# Patient Record
Sex: Male | Born: 1965 | Race: White | Hispanic: No | Marital: Single | Smoking: Current every day smoker
Health system: Southern US, Community
[De-identification: ages and names within clinical notes are randomized; demographics above are authoritative.]

## PROBLEM LIST (undated history)

## (undated) DIAGNOSIS — F1010 Alcohol abuse, uncomplicated: Secondary | ICD-10-CM

## (undated) DIAGNOSIS — K7460 Unspecified cirrhosis of liver: Secondary | ICD-10-CM

## (undated) HISTORY — PX: OTHER SURGICAL HISTORY: SHX169

---

## 2003-05-26 ENCOUNTER — Ambulatory Visit (HOSPITAL_COMMUNITY): Admission: RE | Admit: 2003-05-26 | Discharge: 2003-05-26 | Payer: Self-pay | Admitting: Internal Medicine

## 2006-06-20 ENCOUNTER — Emergency Department (HOSPITAL_COMMUNITY): Admission: EM | Admit: 2006-06-20 | Discharge: 2006-06-20 | Payer: Self-pay | Admitting: Emergency Medicine

## 2006-06-22 ENCOUNTER — Emergency Department (HOSPITAL_COMMUNITY): Admission: EM | Admit: 2006-06-22 | Discharge: 2006-06-22 | Payer: Self-pay | Admitting: Emergency Medicine

## 2016-07-11 ENCOUNTER — Encounter (HOSPITAL_COMMUNITY): Payer: Self-pay

## 2016-07-11 ENCOUNTER — Inpatient Hospital Stay (HOSPITAL_COMMUNITY)
Admission: EM | Admit: 2016-07-11 | Discharge: 2016-07-16 | DRG: 432 | Disposition: A | Discharge status: Home / Self Care | Payer: Self-pay | Attending: Internal Medicine | Admitting: Internal Medicine

## 2016-07-11 ENCOUNTER — Emergency Department (HOSPITAL_COMMUNITY): Payer: Self-pay

## 2016-07-11 DIAGNOSIS — E871 Hypo-osmolality and hyponatremia: Secondary | ICD-10-CM | POA: Diagnosis present

## 2016-07-11 DIAGNOSIS — E878 Other disorders of electrolyte and fluid balance, not elsewhere classified: Secondary | ICD-10-CM | POA: Diagnosis present

## 2016-07-11 DIAGNOSIS — Z880 Allergy status to penicillin: Secondary | ICD-10-CM

## 2016-07-11 DIAGNOSIS — K3189 Other diseases of stomach and duodenum: Secondary | ICD-10-CM | POA: Diagnosis present

## 2016-07-11 DIAGNOSIS — D62 Acute posthemorrhagic anemia: Secondary | ICD-10-CM

## 2016-07-11 DIAGNOSIS — I864 Gastric varices: Secondary | ICD-10-CM | POA: Diagnosis present

## 2016-07-11 DIAGNOSIS — F1010 Alcohol abuse, uncomplicated: Secondary | ICD-10-CM | POA: Diagnosis present

## 2016-07-11 DIAGNOSIS — K449 Diaphragmatic hernia without obstruction or gangrene: Secondary | ICD-10-CM | POA: Diagnosis present

## 2016-07-11 DIAGNOSIS — M79606 Pain in leg, unspecified: Secondary | ICD-10-CM

## 2016-07-11 DIAGNOSIS — F10231 Alcohol dependence with withdrawal delirium: Secondary | ICD-10-CM | POA: Diagnosis present

## 2016-07-11 DIAGNOSIS — K7031 Alcoholic cirrhosis of liver with ascites: Principal | ICD-10-CM | POA: Diagnosis present

## 2016-07-11 DIAGNOSIS — F1220 Cannabis dependence, uncomplicated: Secondary | ICD-10-CM

## 2016-07-11 DIAGNOSIS — R7989 Other specified abnormal findings of blood chemistry: Secondary | ICD-10-CM

## 2016-07-11 DIAGNOSIS — E0781 Sick-euthyroid syndrome: Secondary | ICD-10-CM | POA: Diagnosis present

## 2016-07-11 DIAGNOSIS — R945 Abnormal results of liver function studies: Secondary | ICD-10-CM

## 2016-07-11 DIAGNOSIS — K921 Melena: Secondary | ICD-10-CM | POA: Diagnosis present

## 2016-07-11 DIAGNOSIS — I5031 Acute diastolic (congestive) heart failure: Secondary | ICD-10-CM

## 2016-07-11 DIAGNOSIS — N4889 Other specified disorders of penis: Secondary | ICD-10-CM | POA: Diagnosis present

## 2016-07-11 DIAGNOSIS — I8510 Secondary esophageal varices without bleeding: Secondary | ICD-10-CM | POA: Diagnosis present

## 2016-07-11 DIAGNOSIS — R601 Generalized edema: Secondary | ICD-10-CM | POA: Diagnosis present

## 2016-07-11 DIAGNOSIS — K210 Gastro-esophageal reflux disease with esophagitis: Secondary | ICD-10-CM | POA: Diagnosis present

## 2016-07-11 DIAGNOSIS — R188 Other ascites: Secondary | ICD-10-CM

## 2016-07-11 DIAGNOSIS — D649 Anemia, unspecified: Secondary | ICD-10-CM | POA: Diagnosis present

## 2016-07-11 DIAGNOSIS — K831 Obstruction of bile duct: Secondary | ICD-10-CM | POA: Diagnosis present

## 2016-07-11 DIAGNOSIS — R609 Edema, unspecified: Secondary | ICD-10-CM

## 2016-07-11 DIAGNOSIS — K922 Gastrointestinal hemorrhage, unspecified: Secondary | ICD-10-CM | POA: Diagnosis present

## 2016-07-11 DIAGNOSIS — E869 Volume depletion, unspecified: Secondary | ICD-10-CM | POA: Diagnosis present

## 2016-07-11 DIAGNOSIS — Z720 Tobacco use: Secondary | ICD-10-CM

## 2016-07-11 DIAGNOSIS — M549 Dorsalgia, unspecified: Secondary | ICD-10-CM | POA: Diagnosis present

## 2016-07-11 DIAGNOSIS — I4510 Unspecified right bundle-branch block: Secondary | ICD-10-CM | POA: Diagnosis present

## 2016-07-11 DIAGNOSIS — D638 Anemia in other chronic diseases classified elsewhere: Secondary | ICD-10-CM | POA: Diagnosis present

## 2016-07-11 DIAGNOSIS — J90 Pleural effusion, not elsewhere classified: Secondary | ICD-10-CM

## 2016-07-11 DIAGNOSIS — N5089 Other specified disorders of the male genital organs: Secondary | ICD-10-CM | POA: Diagnosis present

## 2016-07-11 DIAGNOSIS — K766 Portal hypertension: Secondary | ICD-10-CM | POA: Diagnosis present

## 2016-07-11 DIAGNOSIS — F17210 Nicotine dependence, cigarettes, uncomplicated: Secondary | ICD-10-CM | POA: Diagnosis present

## 2016-07-11 DIAGNOSIS — R600 Localized edema: Secondary | ICD-10-CM

## 2016-07-11 DIAGNOSIS — K7011 Alcoholic hepatitis with ascites: Secondary | ICD-10-CM | POA: Diagnosis present

## 2016-07-11 HISTORY — DX: Alcohol abuse, uncomplicated: F10.10

## 2016-07-11 LAB — PROTIME-INR
INR: 1.35
PROTHROMBIN TIME: 16.8 s — AB (ref 11.4–15.2)

## 2016-07-11 LAB — CBC WITH DIFFERENTIAL/PLATELET
BASOS ABS: 0 10*3/uL (ref 0.0–0.1)
BASOS PCT: 0 %
EOS PCT: 0 %
Eosinophils Absolute: 0 10*3/uL (ref 0.0–0.7)
HCT: 17.3 % — ABNORMAL LOW (ref 39.0–52.0)
Hemoglobin: 5.4 g/dL — CL (ref 13.0–17.0)
LYMPHS PCT: 6 %
Lymphs Abs: 0.6 10*3/uL — ABNORMAL LOW (ref 0.7–4.0)
MCH: 24 pg — ABNORMAL LOW (ref 26.0–34.0)
MCHC: 31.2 g/dL (ref 30.0–36.0)
MCV: 76.9 fL — AB (ref 78.0–100.0)
Monocytes Absolute: 0.8 10*3/uL (ref 0.1–1.0)
Monocytes Relative: 9 %
Neutro Abs: 7.6 10*3/uL (ref 1.7–7.7)
Neutrophils Relative %: 85 %
PLATELETS: 166 10*3/uL (ref 150–400)
RBC: 2.25 MIL/uL — AB (ref 4.22–5.81)
RDW: 18.3 % — ABNORMAL HIGH (ref 11.5–15.5)
WBC: 9 10*3/uL (ref 4.0–10.5)

## 2016-07-11 LAB — COMPREHENSIVE METABOLIC PANEL
ALT: 46 U/L (ref 17–63)
AST: 120 U/L — ABNORMAL HIGH (ref 15–41)
Albumin: 3 g/dL — ABNORMAL LOW (ref 3.5–5.0)
Alkaline Phosphatase: 106 U/L (ref 38–126)
Anion gap: 9 (ref 5–15)
BUN: 7 mg/dL (ref 6–20)
CHLORIDE: 82 mmol/L — AB (ref 101–111)
CO2: 22 mmol/L (ref 22–32)
CREATININE: 0.61 mg/dL (ref 0.61–1.24)
Calcium: 8.4 mg/dL — ABNORMAL LOW (ref 8.9–10.3)
Glucose, Bld: 106 mg/dL — ABNORMAL HIGH (ref 65–99)
POTASSIUM: 4 mmol/L (ref 3.5–5.1)
Sodium: 113 mmol/L — CL (ref 135–145)
TOTAL PROTEIN: 6.6 g/dL (ref 6.5–8.1)
Total Bilirubin: 3.5 mg/dL — ABNORMAL HIGH (ref 0.3–1.2)

## 2016-07-11 LAB — CBC
HEMATOCRIT: 17.9 % — AB (ref 39.0–52.0)
Hemoglobin: 5.7 g/dL — CL (ref 13.0–17.0)
MCH: 24.5 pg — ABNORMAL LOW (ref 26.0–34.0)
MCHC: 31.8 g/dL (ref 30.0–36.0)
MCV: 76.8 fL — AB (ref 78.0–100.0)
Platelets: 182 10*3/uL (ref 150–400)
RBC: 2.33 MIL/uL — AB (ref 4.22–5.81)
RDW: 18.3 % — ABNORMAL HIGH (ref 11.5–15.5)
WBC: 10 10*3/uL (ref 4.0–10.5)

## 2016-07-11 LAB — BRAIN NATRIURETIC PEPTIDE: B Natriuretic Peptide: 664 pg/mL — ABNORMAL HIGH (ref 0.0–100.0)

## 2016-07-11 LAB — AMMONIA: Ammonia: 20 umol/L (ref 9–35)

## 2016-07-11 LAB — I-STAT TROPONIN, ED: TROPONIN I, POC: 0.01 ng/mL (ref 0.00–0.08)

## 2016-07-11 LAB — TSH: TSH: 6.321 u[IU]/mL — AB (ref 0.350–4.500)

## 2016-07-11 LAB — MRSA PCR SCREENING: MRSA BY PCR: NEGATIVE

## 2016-07-11 LAB — MAGNESIUM: Magnesium: 1.9 mg/dL (ref 1.7–2.4)

## 2016-07-11 LAB — ABO/RH: ABO/RH(D): A NEG

## 2016-07-11 LAB — POC OCCULT BLOOD, ED: Fecal Occult Bld: POSITIVE — AB

## 2016-07-11 LAB — ETHANOL: ALCOHOL ETHYL (B): 112 mg/dL — AB (ref <5)

## 2016-07-11 LAB — PREPARE RBC (CROSSMATCH)

## 2016-07-11 MED ORDER — PANTOPRAZOLE SODIUM 40 MG IV SOLR
40.0000 mg | Freq: Once | INTRAVENOUS | Status: AC
  Administered 2016-07-11: 40 mg via INTRAVENOUS
  Filled 2016-07-11: qty 40

## 2016-07-11 MED ORDER — PANTOPRAZOLE SODIUM 40 MG IV SOLR
40.0000 mg | Freq: Two times a day (BID) | INTRAVENOUS | Status: DC
  Administered 2016-07-11: 40 mg via INTRAVENOUS
  Filled 2016-07-11: qty 40

## 2016-07-11 MED ORDER — VITAMIN B-1 100 MG PO TABS
100.0000 mg | ORAL_TABLET | Freq: Every day | ORAL | Status: DC
  Administered 2016-07-11 – 2016-07-16 (×5): 100 mg via ORAL
  Filled 2016-07-11 (×5): qty 1

## 2016-07-11 MED ORDER — FOLIC ACID 1 MG PO TABS
1.0000 mg | ORAL_TABLET | Freq: Every day | ORAL | Status: DC
  Administered 2016-07-11 – 2016-07-16 (×5): 1 mg via ORAL
  Filled 2016-07-11 (×5): qty 1

## 2016-07-11 MED ORDER — SODIUM CHLORIDE 0.9% FLUSH
3.0000 mL | Freq: Two times a day (BID) | INTRAVENOUS | Status: DC
  Administered 2016-07-11 – 2016-07-16 (×8): 3 mL via INTRAVENOUS

## 2016-07-11 MED ORDER — ADULT MULTIVITAMIN W/MINERALS CH
1.0000 | ORAL_TABLET | Freq: Every day | ORAL | Status: DC
  Administered 2016-07-11 – 2016-07-16 (×5): 1 via ORAL
  Filled 2016-07-11 (×5): qty 1

## 2016-07-11 MED ORDER — LORAZEPAM 1 MG PO TABS: 1.0000 mg | ORAL_TABLET | Freq: Four times a day (QID) | ORAL | Status: AC | PRN

## 2016-07-11 MED ORDER — LORAZEPAM 2 MG/ML IJ SOLN
0.0000 mg | Freq: Two times a day (BID) | INTRAMUSCULAR | Status: AC
  Administered 2016-07-13: 2 mg via INTRAVENOUS
  Filled 2016-07-11: qty 1

## 2016-07-11 MED ORDER — FUROSEMIDE 10 MG/ML IJ SOLN
40.0000 mg | Freq: Two times a day (BID) | INTRAMUSCULAR | Status: DC
  Administered 2016-07-12 – 2016-07-16 (×9): 40 mg via INTRAVENOUS
  Filled 2016-07-11 (×9): qty 4

## 2016-07-11 MED ORDER — LORAZEPAM 2 MG/ML IJ SOLN
1.0000 mg | Freq: Four times a day (QID) | INTRAMUSCULAR | Status: AC | PRN
  Administered 2016-07-13: 1 mg via INTRAVENOUS
  Filled 2016-07-11: qty 1

## 2016-07-11 MED ORDER — ONDANSETRON HCL 4 MG/2ML IJ SOLN: 4.0000 mg | Freq: Four times a day (QID) | INTRAMUSCULAR | Status: DC | PRN

## 2016-07-11 MED ORDER — SODIUM CHLORIDE 0.9 % IV SOLN
INTRAVENOUS | Status: DC
  Administered 2016-07-11 – 2016-07-13 (×2): via INTRAVENOUS

## 2016-07-11 MED ORDER — ONDANSETRON HCL 4 MG PO TABS: 4.0000 mg | ORAL_TABLET | Freq: Four times a day (QID) | ORAL | Status: DC | PRN

## 2016-07-11 MED ORDER — LORAZEPAM 2 MG/ML IJ SOLN
0.0000 mg | Freq: Four times a day (QID) | INTRAMUSCULAR | Status: AC
  Administered 2016-07-11 – 2016-07-12 (×2): 4 mg via INTRAVENOUS
  Administered 2016-07-12: 2 mg via INTRAVENOUS
  Administered 2016-07-12 – 2016-07-13 (×2): 4 mg via INTRAVENOUS
  Administered 2016-07-13: 2 mg via INTRAVENOUS
  Administered 2016-07-13: 4 mg via INTRAVENOUS
  Filled 2016-07-11 (×6): qty 2
  Filled 2016-07-11: qty 1

## 2016-07-11 MED ORDER — SODIUM CHLORIDE 0.9 % IV SOLN
Freq: Once | INTRAVENOUS | Status: AC
  Administered 2016-07-11: 19:00:00 via INTRAVENOUS

## 2016-07-11 MED ORDER — THIAMINE HCL 100 MG/ML IJ SOLN
100.0000 mg | Freq: Every day | INTRAMUSCULAR | Status: DC
  Filled 2016-07-11: qty 2

## 2016-07-11 MED ORDER — THIAMINE HCL 100 MG/ML IJ SOLN
Freq: Once | INTRAVENOUS | Status: DC
  Filled 2016-07-11: qty 1000

## 2016-07-11 MED ORDER — MORPHINE SULFATE (PF) 4 MG/ML IV SOLN
4.0000 mg | Freq: Once | INTRAVENOUS | Status: AC
  Administered 2016-07-11: 4 mg via INTRAVENOUS
  Filled 2016-07-11: qty 1

## 2016-07-11 NOTE — ED Notes (Signed)
Patient transported to X-ray 

## 2016-07-11 NOTE — ED Triage Notes (Addendum)
Reports of bilateral lower leg swelling into abdomen x3 weeks. States he has felt a little short of breath with ambulation denies at this time.  Reports of drinking 12-16 beers daily.    Patient reports of weighing 208 3 months ago.

## 2016-07-11 NOTE — ED Notes (Signed)
CRITICAL VALUE ALERT  Critical Value:  Hemoglobin = 5.4  Date & Time Notied:  07/11/16  Provider Notified: Lynelle DoctorKnapp  Orders Received/Actions taken: type and screen

## 2016-07-11 NOTE — H&P (Signed)
History and Physical    Glenn Black ZOX:[id number] DOB: 03/10/1965 DOA: 07/11/2016  PCP: Patient, No Pcp Per  Patient coming from: Home.    Chief Complaint:  Abdominal swelling and both leg swelling.   HPI: Glenn Black is an 51 y.o. male with benign PMH on No meds, but clearly by virtue of never seeing any physician, drinks significantly (daily 16 beers per day), presented to the ER with swelling of his abdomen and both legs.  He has no hematemesis, and had no black stool.  He has no abdominal pain.  Evaluation in the ER showed stable hemodynamics, but with Hb of 5.7 g per dL, and was confirmed with repeated lab.  His Na was 113, normal Cr and normal K.  Total bili was 3.5, with INR of 1.35.  He has no leukocytosis and platelet count was not low.  His stool guaic was negative.  EDP spoke with Dr Kendell Bane of GI, and hospitalist was asked to admit him for new diagnosis of alcoholic cirrhosis, with hyponatremia, GI bleed with Hb of 5.7, and anasarca with albumin at 3 and Cr of 0.61.      ED Course:  See above.  Rewiew of Systems:  Constitutional: Negative for malaise, fever and chills. No significant weight loss or weight gain Eyes: Negative for eye pain, redness and discharge, diplopia, visual changes, or flashes of light. ENMT: Negative for ear pain, hoarseness, nasal congestion, sinus pressure and sore throat. No headaches; tinnitus, drooling, or problem swallowing. Cardiovascular: Negative for chest pain, palpitations, diaphoresis, dyspnea and peripheral edema. ; No orthopnea, PND Respiratory: Negative for cough, hemoptysis, wheezing and stridor. No pleuritic chestpain. Gastrointestinal: Negative for diarrhea, constipation,  melena, blood in stool, hematemesis, jaundice and rectal bleeding.    Genitourinary: Negative for frequency, dysuria, incontinence,flank pain and hematuria; Musculoskeletal: Negative for back pain and neck pain. Negative for swelling and trauma.;  Skin: . Negative  for pruritus, rash, abrasions, bruising and skin lesion.; ulcerations Neuro: Negative for headache, lightheadedness and neck stiffness. Negative for weakness, altered level of consciousness , altered mental status, extremity weakness, burning feet, involuntary movement, seizure and syncope.  Psych: negative for anxiety, depression, insomnia, tearfulness, panic attacks, hallucinations, paranoia, suicidal or homicidal ideation   Past Medical History:  Diagnosis Date  . Alcohol abuse      History reviewed. No pertinent surgical history.   reports that he has been smoking Cigarettes.  He has been smoking about 1.00 pack per day. He has never used smokeless tobacco. He reports that he drinks alcohol. He reports that he uses drugs, including Marijuana.  Allergies  Allergen Reactions  . Penicillins     unk    No family history on file.   Prior to Admission medications   Not on File    Physical Exam: Vitals:   07/11/16 1351 07/11/16 1800 07/11/16 1815  BP: (!) 110/53 (!) 143/73   Pulse: (!) 105 (!) 101 100  Resp: 17 (!) 21 (!) 23  Temp: 98.3 F (36.8 C)    TempSrc: Oral    SpO2: 100% 100% 99%  Weight: 111.1 kg (245 lb)    Height: 6' (1.829 m)        Constitutional: NAD, calm, comfortable Vitals:   07/11/16 1351 07/11/16 1800 07/11/16 1815  BP: (!) 110/53 (!) 143/73   Pulse: (!) 105 (!) 101 100  Resp: 17 (!) 21 (!) 23  Temp: 98.3 F (36.8 C)    TempSrc: Oral    SpO2: 100%  100% 99%  Weight: 111.1 kg (245 lb)    Height: 6' (1.829 m)     Eyes: PERRL, lids and conjunctivae normal.   ENMT: Mucous membranes are moist. Posterior pharynx clear of any exudate or lesions.Normal dentition.  Neck: normal, supple, no masses, no thyromegaly Respiratory: clear to auscultation bilaterally, no wheezing, no crackles. Normal respiratory effort. No accessory muscle use.  Cardiovascular: Regular rate and rhythm, no murmurs / rubs / gallops. No extremity edema. 2+ pedal pulses. No  carotid bruits.  Abdomen: no tenderness, no masses palpated. No hepatosplenomegaly. Bowel sounds positive. Tense abdomen, non tender.  Musculoskeletal: no clubbing / cyanosis. No joint deformity upper and lower extremities. Good ROM, no contractures. Normal muscle tone.  Skin: no rashes, lesions, ulcers. No induration Neurologic: CN 2-12 grossly intact. Sensation intact, DTR normal. Strength 5/5 in all 4. No asterixis.  Psychiatric: Normal judgment and insight. Alert and oriented x 3. Normal mood.    Labs on Admission: I have personally reviewed following labs and imaging studies  CBC:  Recent Labs Lab 07/11/16 1731 07/11/16 1848  WBC 9.0 10.0  NEUTROABS 7.6  --   HGB 5.4* 5.7*  HCT 17.3* 17.9*  MCV 76.9* 76.8*  PLT 166 182   Basic Metabolic Panel:  Recent Labs Lab 07/11/16 1731  NA 113*  K 4.0  CL 82*  CO2 22  GLUCOSE 106*  BUN 7  CREATININE 0.61  CALCIUM 8.4*   GFR: Estimated Creatinine Clearance: 143.8 mL/min (by C-G formula based on SCr of 0.61 mg/dL). Liver Function Tests:  Recent Labs Lab 07/11/16 1731  AST 120*  ALT 46  ALKPHOS 106  BILITOT 3.5*  PROT 6.6  ALBUMIN 3.0*   Coagulation Profile:  Recent Labs Lab 07/11/16 1731  INR 1.35   Radiological Exams on Admission: Dg Chest 2 View  Result Date: 07/11/2016 CLINICAL DATA:  51 year old male with bilateral lower extremity swelling and shortness of breath. EXAM: CHEST  2 VIEW COMPARISON:  Chest radiograph dated 05/26/2003 FINDINGS: Two views of the chest demonstrate diffuse vascular and interstitial prominence. There are small bilateral pleural effusions. Associated subsegmental atelectatic changes of the lung bases. There is no pneumothorax. Top-normal cardiac size. No acute osseous pathology. IMPRESSION: Small bilateral pleural effusions and diffuse interstitial edema may represent changes of CHF or fluid fluid. Clinical correlation is recommended. Electronically Signed   By: Elgie Collard M.D.    On: 07/11/2016 18:07    EKG: Independently reviewed.   Assessment/Plan Principal Problem:   Alcoholic cirrhosis of liver with ascites (HCC) Active Problems:   Hyponatremia   GI bleeding   Anemia   Anasarca   Alcohol abuse   GI bleed    PLAN:   Anasarca:  I suspect he has alcoholic cirrhosis, and has anasarca with swelling of both legs, significant ascites, and fortunately low INR and close to normal albumin.  Have consult GI, and will await further recommendation.  He may benefit getting prednisone, but will defer decision to GI.  Will place him on fluid restriction, and give IV Lasix.  Will need to correct Na.    Anemia:  Slow bleeding, as he compensated quite well.  Will give 2 units of PRBC.  He may have E. Varices, but I don't think it is bleeding varices.  Suspect he has gastritis or PUD.  Will give BID PPI IV.    Hyponatremia:  WIll give IV NaCL, along with fluid restriction and IV Lasix.  Will follow Na.  Likely not acute hyponatremia.  Alcohol abuse:  Significant, and he will likely go into withdrawal in 2 days.  Will order CIWA, and admit him to the ICU.  Give Banana bag.  I told him he is gravely ill although he doesn't feel terrible.  He MUST stop drinking.  I called his sister to communicate with her with his permission, unsuccesfully.  He said she would be his surrogate decision maker.    DVT prophylaxis: None.  Code Status: FULL CODE.  Family Communication: None.  Disposition Plan: To home.  Consults called: GI Dr Kendell Baneourke.  Admission status: inpatient.    Jonnatan Hanners MD FACP. [facility]  If 7PM-7AM, please contact night-coverage [email or web address] Password [email or web address]  07/11/2016, 7:49 PM

## 2016-07-11 NOTE — ED Notes (Signed)
CRITICAL VALUE ALERT  Critical Value:  Na = 113  Date & Time Notied:  07/11/16  Provider Notified: Lynelle DoctorKnapp  Orders Received/Actions taken:

## 2016-07-11 NOTE — ED Provider Notes (Signed)
[facility]-EMERGENCY DEPT Provider Note   CSN: [id number] Arrival date & time: 07/11/16  1323     History   Chief Complaint Chief Complaint  Patient presents with  . Leg Swelling    HPI Glenn Black is a 51 y.o. male.  HPI Patient presents to the emergency room for evaluation of abdominal and leg swelling. She states he noticed the symptoms about 3 weeks ago. Over the last week or so the symptoms have gotten significantly worse. He decided to come into the emergency room today because he could not tolerate it any longer. He is even noticed some swelling of the penis. Patient denies any history of any medical problems. He does smoke regularly. He also drinks about 16 beers daily. Patient states a few months ago he weighed 208 pounds.  In the ED today he weighs 245 pounds. Patient denies any fevers or chills. No abdominal pain. No dysuria. Past Medical History:  Diagnosis Date  . Alcohol abuse     There are no active problems to display for this patient.   History reviewed. No pertinent surgical history.     Home Medications    Prior to Admission medications   Not on File    Family History No family history on file.  Social History Social History  Substance Use Topics  . Smoking status: Current Every Day Smoker    Packs/day: 1.00    Types: Cigarettes  . Smokeless tobacco: Never Used  . Alcohol use Yes     Comment: 12-16 beers daily     Allergies   Penicillins   Review of Systems Review of Systems  All other systems reviewed and are negative.    Physical Exam Updated Vital Signs BP (!) 143/73   Pulse 100   Temp 98.3 F (36.8 C) (Oral)   Resp (!) 23   Ht 1.829 m (6')   Wt 111.1 kg (245 lb)   SpO2 99%   BMI 33.23 kg/m   Physical Exam  Constitutional: He appears well-developed and well-nourished. No distress.  HENT:  Head: Normocephalic and atraumatic.  Right Ear: External ear normal.  Left Ear: External ear normal.  Eyes: Conjunctivae are  normal. Right eye exhibits no discharge. Left eye exhibits no discharge. No scleral icterus.  Neck: Neck supple. No tracheal deviation present.  Cardiovascular: Regular rhythm and intact distal pulses.  Tachycardia present.   Pulmonary/Chest: Effort normal and breath sounds normal. No stridor. No respiratory distress. He has no wheezes. He has no rales.  Abdominal: Soft. Bowel sounds are normal. He exhibits no distension. There is no tenderness. There is no rebound and no guarding.  Protuberant abdomen, probable ascites  Genitourinary:  Genitourinary Comments: Edema noted of the penile shaft  Musculoskeletal: He exhibits edema ( Pitting edema up to his thighs). He exhibits no tenderness.  Neurological: He is alert. He has normal strength. No cranial nerve deficit (no facial droop, extraocular movements intact, no slurred speech) or sensory deficit. He exhibits normal muscle tone. He displays no seizure activity. Coordination normal.  Skin: Skin is warm and dry. No rash noted.  Psychiatric: He has a normal mood and affect.  Nursing note and vitals reviewed.    ED Treatments / Results  Labs (all labs ordered are listed, but only abnormal results are displayed) Labs Reviewed  COMPREHENSIVE METABOLIC PANEL - Abnormal; Notable for the following:       Result Value   Sodium 113 (*)    Chloride 82 (*)  Glucose, Bld 106 (*)    Calcium 8.4 (*)    Albumin 3.0 (*)    AST 120 (*)    Total Bilirubin 3.5 (*)    All other components within normal limits  CBC WITH DIFFERENTIAL/PLATELET - Abnormal; Notable for the following:    RBC 2.25 (*)    Hemoglobin 5.4 (*)    HCT 17.3 (*)    MCV 76.9 (*)    MCH 24.0 (*)    RDW 18.3 (*)    Lymphs Abs 0.6 (*)    All other components within normal limits  PROTIME-INR - Abnormal; Notable for the following:    Prothrombin Time 16.8 (*)    All other components within normal limits  POC OCCULT BLOOD, ED - Abnormal; Notable for the following:    Fecal  Occult Bld POSITIVE (*)    All other components within normal limits  ETHANOL  URINALYSIS, ROUTINE W REFLEX MICROSCOPIC  BRAIN NATRIURETIC PEPTIDE  CBC  I-STAT TROPOININ, ED  TYPE AND SCREEN  PREPARE RBC (CROSSMATCH)    EKG  EKG Interpretation  Date/Time:  Monday Jul 11 2016 17:27:57 EDT Ventricular Rate:  99 PR Interval:    QRS Duration: 121 QT Interval:  380 QTC Calculation: 488 R Axis:   75 Text Interpretation:  Sinus rhythm Right bundle branch block Baseline wander in lead(s) V5 No old tracing to compare Confirmed by Linwood Dibbles [phone]) on 07/11/2016 5:39:22 PM       Radiology Dg Chest 2 View  Result Date: 07/11/2016 CLINICAL DATA:  51 year old male with bilateral lower extremity swelling and shortness of breath. EXAM: CHEST  2 VIEW COMPARISON:  Chest radiograph dated 05/26/2003 FINDINGS: Two views of the chest demonstrate diffuse vascular and interstitial prominence. There are small bilateral pleural effusions. Associated subsegmental atelectatic changes of the lung bases. There is no pneumothorax. Top-normal cardiac size. No acute osseous pathology. IMPRESSION: Small bilateral pleural effusions and diffuse interstitial edema may represent changes of CHF or fluid fluid. Clinical correlation is recommended. Electronically Signed   By: Elgie Collard M.D.   On: 07/11/2016 18:07    Procedures .Critical Care Performed by: Linwood Dibbles Authorized by: Linwood Dibbles   Critical care provider statement:    Critical care time (minutes):  30   Critical care was time spent personally by me on the following activities:  Discussions with consultants, evaluation of patient's response to treatment, examination of patient, ordering and performing treatments and interventions, ordering and review of laboratory studies, ordering and review of radiographic studies, pulse oximetry, re-evaluation of patient's condition, obtaining history from patient or surrogate and review of old charts    (including critical care time)  Medications Ordered in ED Medications  0.9 %  sodium chloride infusion (not administered)  morphine 4 MG/ML injection 4 mg (not administered)  pantoprazole (PROTONIX) injection 40 mg (not administered)     Initial Impression / Assessment and Plan / ED Course  I have reviewed the triage vital signs and the nursing notes.  Pertinent labs & imaging results that were available during my care of the patient were reviewed by me and considered in my medical decision making (see chart for details).  Clinical Course as of Jul 11 1909  Mon Jul 11, 2016  1828 Hgb is extremely low.  Pt does not look pale at all on exam.  Will repeat his CBC in case this is a lab error.  Will guiac stools  [JK]  1840 Stool is guiac positive.  Suspect his  hemoglobin is accurate at this point.  Will still repeat but I will order a blood transfusion   [JK]  1842 Pt request pain medications for his leg swelling  [JK]  1855 D/w Dr Jena Gauss.  Recommends starting a PPI.  Will see patient in the AM  [JK]    Clinical Course User Index [JK] Linwood Dibbles, MD   The patient presented to the emergency room with a new complaint of abdominal and lower extremity edema in the setting of chronic alcohol abuse. Symptoms are most suggestive of cirrhosis and anasarca. Surprising the patient is also extremely anemic with hemoglobin 5.4. I will repeat that test to make sure it. However he is guaiac positive in most likely has a component of GI bleed. Plan on blood transfusions.  I discussed the case with Dr. Jena Gauss. He will consult on the patient and warning. I spoke with Dr. Nedra Hai. Plan on admission to the hospital for further evaluation  Final Clinical Impressions(s) / ED Diagnoses   Final diagnoses:  Peripheral edema  Gastrointestinal hemorrhage, unspecified gastrointestinal hemorrhage type  Anemia, unspecified type  Hyponatremia      Linwood Dibbles, MD 07/11/16 [phone]

## 2016-07-12 ENCOUNTER — Inpatient Hospital Stay (HOSPITAL_COMMUNITY): Payer: Self-pay

## 2016-07-12 ENCOUNTER — Encounter (HOSPITAL_COMMUNITY): Payer: Self-pay | Admitting: Gastroenterology

## 2016-07-12 DIAGNOSIS — Z720 Tobacco use: Secondary | ICD-10-CM

## 2016-07-12 DIAGNOSIS — J90 Pleural effusion, not elsewhere classified: Secondary | ICD-10-CM

## 2016-07-12 DIAGNOSIS — I340 Nonrheumatic mitral (valve) insufficiency: Secondary | ICD-10-CM

## 2016-07-12 DIAGNOSIS — I5031 Acute diastolic (congestive) heart failure: Secondary | ICD-10-CM

## 2016-07-12 DIAGNOSIS — R609 Edema, unspecified: Secondary | ICD-10-CM

## 2016-07-12 DIAGNOSIS — K7031 Alcoholic cirrhosis of liver with ascites: Principal | ICD-10-CM

## 2016-07-12 DIAGNOSIS — K7030 Alcoholic cirrhosis of liver without ascites: Secondary | ICD-10-CM

## 2016-07-12 DIAGNOSIS — F1220 Cannabis dependence, uncomplicated: Secondary | ICD-10-CM

## 2016-07-12 DIAGNOSIS — D62 Acute posthemorrhagic anemia: Secondary | ICD-10-CM

## 2016-07-12 DIAGNOSIS — R601 Generalized edema: Secondary | ICD-10-CM

## 2016-07-12 DIAGNOSIS — F17200 Nicotine dependence, unspecified, uncomplicated: Secondary | ICD-10-CM

## 2016-07-12 LAB — IRON AND TIBC
Iron: 230 ug/dL — ABNORMAL HIGH (ref 45–182)
SATURATION RATIOS: 76 % — AB (ref 17.9–39.5)
TIBC: 302 ug/dL (ref 250–450)
UIBC: 72 ug/dL

## 2016-07-12 LAB — BASIC METABOLIC PANEL
ANION GAP: 8 (ref 5–15)
Anion gap: 8 (ref 5–15)
Anion gap: 10 (ref 5–15)
BUN: 7 mg/dL (ref 6–20)
BUN: 7 mg/dL (ref 6–20)
BUN: 7 mg/dL (ref 6–20)
CHLORIDE: 87 mmol/L — AB (ref 101–111)
CHLORIDE: 87 mmol/L — AB (ref 101–111)
CO2: 24 mmol/L (ref 22–32)
CO2: 25 mmol/L (ref 22–32)
CO2: 24 mmol/L (ref 22–32)
Calcium: 8.3 mg/dL — ABNORMAL LOW (ref 8.9–10.3)
Calcium: 8.1 mg/dL — ABNORMAL LOW (ref 8.9–10.3)
Calcium: 8.4 mg/dL — ABNORMAL LOW (ref 8.9–10.3)
Chloride: 86 mmol/L — ABNORMAL LOW (ref 101–111)
Creatinine, Ser: 0.51 mg/dL — ABNORMAL LOW (ref 0.61–1.24)
Creatinine, Ser: 0.5 mg/dL — ABNORMAL LOW (ref 0.61–1.24)
Creatinine, Ser: 0.63 mg/dL (ref 0.61–1.24)
GFR calc Af Amer: >60 mL/min (ref >60)
GFR calc non Af Amer: >60 mL/min (ref >60)
GLUCOSE: 94 mg/dL (ref 65–99)
Glucose, Bld: 108 mg/dL — ABNORMAL HIGH (ref 65–99)
Glucose, Bld: 109 mg/dL — ABNORMAL HIGH (ref 65–99)
POTASSIUM: 3.8 mmol/L (ref 3.5–5.1)
POTASSIUM: 3.9 mmol/L (ref 3.5–5.1)
POTASSIUM: 4 mmol/L (ref 3.5–5.1)
SODIUM: 120 mmol/L — AB (ref 135–145)
SODIUM: 121 mmol/L — AB (ref 135–145)
Sodium: 118 mmol/L — CL (ref 135–145)

## 2016-07-12 LAB — RAPID URINE DRUG SCREEN, HOSP PERFORMED
Amphetamines: NOT DETECTED
BENZODIAZEPINES: POSITIVE — AB
Barbiturates: NOT DETECTED
COCAINE: NOT DETECTED
OPIATES: POSITIVE — AB
Tetrahydrocannabinol: NOT DETECTED

## 2016-07-12 LAB — ECHOCARDIOGRAM COMPLETE
CHL CUP MV DEC (S): 183
CHL CUP STROKE VOLUME: 70 mL
E decel time: 183 msec
EERAT: 11.77
FS: 39 % (ref 28–44)
Height: 72 in
IV/PV OW: 1.07
LA ID, A-P, ES: 40 mm
LA vol index: 33.4 mL/m2
LA vol: 80.1 mL
LADIAMINDEX: 1.67 cm/m2
LAVOLA4C: 88.6 mL
LDCA: 3.8 cm2
LEFT ATRIUM END SYS DIAM: 40 mm
LV PW d: 11.1 mm — AB (ref 0.6–1.1)
LV SIMPSON'S DISK: 59
LV dias vol: 118 mL (ref 62–150)
LV e' LATERAL: 14.7 cm/s
LV sys vol index: 20 mL/m2
LV sys vol: 48 mL (ref 21–61)
LVDIAVOLIN: 49 mL/m2
LVEEAVG: 11.77
LVEEMED: 11.77
LVOT VTI: 24.6 cm
LVOT diameter: 22 mm
LVOT peak grad rest: 11 mmHg
LVOT peak vel: 163 cm/s
LVOTSV: 93 mL
Lateral S' vel: 17.8 cm/s
MV pk A vel: 72.2 m/s
MVPG: 12 mmHg
MVPKEVEL: 173 m/s
TAPSE: 25.8 mm
TDI e' lateral: 14.7
TDI e' medial: 11.1
Weight: 3876.57 oz

## 2016-07-12 LAB — URINALYSIS, ROUTINE W REFLEX MICROSCOPIC
BILIRUBIN URINE: NEGATIVE
GLUCOSE, UA: NEGATIVE mg/dL
KETONES UR: NEGATIVE mg/dL
LEUKOCYTES UA: NEGATIVE
NITRITE: NEGATIVE
PH: 6 (ref 5.0–8.0)
PROTEIN: NEGATIVE mg/dL
Specific Gravity, Urine: 1.005 (ref 1.005–1.030)

## 2016-07-12 LAB — CBC
HEMATOCRIT: 21.1 % — AB (ref 39.0–52.0)
HEMOGLOBIN: 6.6 g/dL — AB (ref 13.0–17.0)
MCH: 24.8 pg — AB (ref 26.0–34.0)
MCHC: 31.3 g/dL (ref 30.0–36.0)
MCV: 79.3 fL (ref 78.0–100.0)
Platelets: 146 10*3/uL — ABNORMAL LOW (ref 150–400)
RBC: 2.66 MIL/uL — ABNORMAL LOW (ref 4.22–5.81)
RDW: 18 % — AB (ref 11.5–15.5)
WBC: 6.6 10*3/uL (ref 4.0–10.5)

## 2016-07-12 LAB — HEPATIC FUNCTION PANEL
ALBUMIN: 3 g/dL — AB (ref 3.5–5.0)
ALK PHOS: 100 U/L (ref 38–126)
ALT: 46 U/L (ref 17–63)
AST: 123 U/L — ABNORMAL HIGH (ref 15–41)
BILIRUBIN INDIRECT: 5.1 mg/dL — AB (ref 0.3–0.9)
BILIRUBIN TOTAL: 7.4 mg/dL — AB (ref 0.3–1.2)
Bilirubin, Direct: 2.3 mg/dL — ABNORMAL HIGH (ref 0.1–0.5)
TOTAL PROTEIN: 6.6 g/dL (ref 6.5–8.1)

## 2016-07-12 LAB — FOLATE: FOLATE: 20.5 ng/mL (ref >5.9)

## 2016-07-12 LAB — OSMOLALITY: Osmolality: 250 mOsm/kg — ABNORMAL LOW (ref 275–295)

## 2016-07-12 LAB — T4, FREE: FREE T4: 0.8 ng/dL (ref 0.61–1.12)

## 2016-07-12 LAB — FERRITIN: FERRITIN: 46 ng/mL (ref 24–336)

## 2016-07-12 LAB — PREPARE RBC (CROSSMATCH)

## 2016-07-12 LAB — VITAMIN B12: VITAMIN B 12: 1337 pg/mL — AB (ref 180–914)

## 2016-07-12 LAB — OSMOLALITY, URINE: Osmolality, Ur: 214 mOsm/kg — ABNORMAL LOW (ref 300–900)

## 2016-07-12 LAB — PROTIME-INR
INR: 1.29
PROTHROMBIN TIME: 16.2 s — AB (ref 11.4–15.2)

## 2016-07-12 MED ORDER — PANTOPRAZOLE SODIUM 40 MG PO TBEC
40.0000 mg | DELAYED_RELEASE_TABLET | Freq: Two times a day (BID) | ORAL | Status: DC
  Administered 2016-07-12 – 2016-07-16 (×7): 40 mg via ORAL
  Filled 2016-07-12 (×7): qty 1

## 2016-07-12 MED ORDER — SODIUM CHLORIDE 0.9 % IV SOLN
Freq: Once | INTRAVENOUS | Status: AC
  Administered 2016-07-12: 10:00:00 via INTRAVENOUS

## 2016-07-12 MED ORDER — CHLORDIAZEPOXIDE HCL 25 MG PO CAPS
50.0000 mg | ORAL_CAPSULE | Freq: Four times a day (QID) | ORAL | Status: AC
  Administered 2016-07-12 – 2016-07-14 (×7): 50 mg via ORAL
  Filled 2016-07-12 (×7): qty 2

## 2016-07-12 MED ORDER — NICOTINE 21 MG/24HR TD PT24
21.0000 mg | MEDICATED_PATCH | Freq: Every day | TRANSDERMAL | Status: DC
  Administered 2016-07-12 – 2016-07-16 (×4): 21 mg via TRANSDERMAL
  Filled 2016-07-12 (×4): qty 1

## 2016-07-12 NOTE — Progress Notes (Addendum)
PROGRESS NOTE  TIARA BARTOLI ZOX:[id number] DOB: September 04, 1965 DOA: 07/11/2016 PCP: Patient, No Pcp Per  Brief History:  51 year old male with a history of alcohol abuse without any documented chronic medical problems. However, the patient states that he has not seen a physician since he was a child. The patient states that he drinks approximately 18 beers daily. His sister insisted he come to the emergency department because of increasing lower extremity edema and increasing abdominal girth. The patient states that he has been smoking 1-1/2 packs per day and using marijuana. He denies any other illegal drug use. He has been complaining of increasing dyspnea on exertion without any chest pain or syncope. He denies fevers, chills, headache, chest pain, abdominal pain nausea, vomiting, diarrhea. He endorses melanotic stools without hematochezia or hematemesis. His leg edema has progressed to the point he is having difficulty walking due to the "heaviness" of his legs. He also endorses increasing scrotal edema without actual pain. There is no dysuria or hematuria. He states that he has been drinking alcohol daily for the last 30 years.  In the emergency department, the patient was noted to have sodium 113 with hemoglobin 5.7. Chest x-ray shows small bilateral pleural effusions and increasing interstitial markings. EKG shows sinus rhythm with right bundle branch block. Ammonia was 20. INR was 1.35.  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 -Request IR paracentesis--send ascites for cytology, albumin, LDH, protein -Hepatitis B surface antigen -Hepatitis C antibody -HIV -Alpha-1 antitrypsin -Urine drug screen--benzo and opiates -anasarca likely a combo of CHF and decompensated cirrhosis  Acute diastolic CHF -5/29 Echo--EF 65-70%, CVP 15 -BNP 664 -continue IV Lasix -daily weights -remains clinically fluid overloaded with JVD  Acute blood  loss anemia -Presenting hemoglobin 5.7 -Transfused 2 units on the evening of 07/11/2016 -Transfused 2 additional units -GI has been consulted -Start PPI -Clear liquids only  Hyponatremia -Multifactorial including poor solute intake, cirrhosis, and intravascular volume depletion -Urine osmolarity -Serum osmolarity -Judicious fluids with intravenous furosemide -Monitor BMP regularly  Alcohol abuse -Alcohol withdrawal protocol -Cessation discussed -librium started by GI  Tobacco abuse -Cessation discussed -NicoDerm patch  Right bundle-branch block -No previous EKGs to compare -Echocardiogram  Bilateral pleural effusions -Echocardiogram -Suspect the patient likely has hepato thorax -Suspect that his "interstitial markings" or likely chronic secondary to tobacco use  Lower extremity edema and pain -Venous duplex  Elevated TSH -Free T4    Disposition Plan:   Home in 2-3 days  Family Communication:   No Family at bedside  Consultants:  GI  Code Status:  FULL   DVT Prophylaxis:  SCDs   Procedures: As Listed in Progress Note Above  Antibiotics: None    Subjective: Patient denies fevers, chills, headache, chest pain, dyspnea, nausea, vomiting, diarrhea, abdominal pain, dysuria, hematuria, hematochezia, and melena. He complains of some leg pain and heaviness bilateral.   Objective: Vitals:   07/12/16 0400 07/12/16 0415 07/12/16 0500 07/12/16 0600  BP: 116/65 119/66  127/74  Pulse: 93 93  100  Resp: 18 18  (!) 22  Temp:  98.6 F (37 C) 97.4 F (36.3 C)   TempSrc:  Oral Oral   SpO2: 92% 92%  95%  Weight:   109.9 kg (242 lb 4.6 oz)   Height:        Intake/Output Summary (Last 24 hours) at 07/12/16 0749 Last data filed at 07/12/16 0700  Gross per 24 hour  Intake  905 ml  Output             1150 ml  Net             -245 ml   Weight change:  Exam:   General:  Pt is alert, follows commands appropriately, not in acute  distress  HEENT: No icterus, No thrush, No neck mass, [place]/AT  Cardiovascular: RRR, S1/S2, no rubs, no gallops  Respiratory: Bibasilar crackles. No wheeze  Abdomen: Soft/+BS, non tender, non distended, no guarding  Extremities: 2+ edema, No lymphangitis, No petechiae, No rashes, no synovitis   Data Reviewed: I have personally reviewed following labs and imaging studies Basic Metabolic Panel:  Recent Labs Lab 07/11/16 1731 07/11/16 1751 07/12/16 0652  NA 113*  --  118*  K 4.0  --  3.8  CL 82*  --  86*  CO2 22  --  24  GLUCOSE 106*  --  94  BUN 7  --  7  CREATININE 0.61  --  0.51*  CALCIUM 8.4*  --  8.3*  MG  --  1.9  --    Liver Function Tests:  Recent Labs Lab 07/11/16 1731  AST 120*  ALT 46  ALKPHOS 106  BILITOT 3.5*  PROT 6.6  ALBUMIN 3.0*   No results for input(s): LIPASE, AMYLASE in the last 168 hours.  Recent Labs Lab 07/11/16 2018  AMMONIA 20   Coagulation Profile:  Recent Labs Lab 07/11/16 1731  INR 1.35   CBC:  Recent Labs Lab 07/11/16 1731 07/11/16 1848 07/12/16 0652  WBC 9.0 10.0 6.6  NEUTROABS 7.6  --   --   HGB 5.4* 5.7* 6.6*  HCT 17.3* 17.9* 21.1*  MCV 76.9* 76.8* 79.3  PLT 166 182 146*   Cardiac Enzymes: No results for input(s): CKTOTAL, CKMB, CKMBINDEX, TROPONINI in the last 168 hours. BNP: Invalid input(s): POCBNP CBG: No results for input(s): GLUCAP in the last 168 hours. HbA1C: No results for input(s): HGBA1C in the last 72 hours. Urine analysis: No results found for: COLORURINE, APPEARANCEUR, LABSPEC, PHURINE, GLUCOSEU, HGBUR, BILIRUBINUR, KETONESUR, PROTEINUR, UROBILINOGEN, NITRITE, LEUKOCYTESUR Sepsis Labs: @LABRCNTIP (procalcitonin:4,lacticidven:4) ) Recent Results (from the past 240 hour(s))  MRSA PCR Screening     Status: None   Collection Time: 07/11/16  9:15 PM  Result Value Ref Range Status   MRSA by PCR NEGATIVE NEGATIVE Final    Comment:        The GeneXpert MRSA Assay (FDA approved for NASAL  specimens only), is one component of a comprehensive MRSA colonization surveillance program. It is not intended to diagnose MRSA infection nor to guide or monitor treatment for MRSA infections.      Scheduled Meds: . folic acid  1 mg Oral Daily  . furosemide  40 mg Intravenous BID  . LORazepam  0-4 mg Intravenous Q6H   Followed by  . [START ON 07/13/2016] LORazepam  0-4 mg Intravenous Q12H  . multivitamin with minerals  1 tablet Oral Daily  . pantoprazole (PROTONIX) IV  40 mg Intravenous Q12H  . sodium chloride flush  3 mL Intravenous Q12H  . thiamine  100 mg Oral Daily   Or  . thiamine  100 mg Intravenous Daily   Continuous Infusions: . sodium chloride 50 mL/hr at 07/11/16 2215  . banana bag IV 1000 mL Stopped (07/11/16 2130)    Procedures/Studies: Dg Chest 2 View  Result Date: 07/11/2016 CLINICAL DATA:  51 year old male with bilateral lower extremity swelling and shortness of breath. EXAM: CHEST  2 VIEW COMPARISON:  Chest radiograph dated 05/26/2003 FINDINGS: Two views of the chest demonstrate diffuse vascular and interstitial prominence. There are small bilateral pleural effusions. Associated subsegmental atelectatic changes of the lung bases. There is no pneumothorax. Top-normal cardiac size. No acute osseous pathology. IMPRESSION: Small bilateral pleural effusions and diffuse interstitial edema may represent changes of CHF or fluid fluid. Clinical correlation is recommended. Electronically Signed   By: Elgie CollardArash  Radparvar M.D.   On: 07/11/2016 18:07    Anay Rathe, DO  [facility] Pager [phone]  If 7PM-7AM, please contact night-coverage [email or web address] Password [email or web address] 07/12/2016, 7:49 AM   LOS: 1 day

## 2016-07-12 NOTE — Consult Note (Signed)
Referring Provider: Dr. Conley RollsLe  Primary Care Physician:  Patient, No Pcp Per Primary Gastroenterologist:  Dr. Darrick PennaFields   Date of Admission: 07/11/16 Date of Consultation: 07/12/16  Reason for Consultation:  GI bleed   HPI:  Glenn Black is a 51 y.o. year old male with a history of alcohol abuse, endorsing 18 beers per day for over 20 years. He denies any known history of chronic liver disease. States he noted abdominal swelling and lower extremity swelling 3 weeks ago, worsening in severity and prompting ED presentation. His sister is at bedside and states she took him to the ED. Nursing staff deny any evidence of overt GI bleeding. Heme positive in the ED, with admitting Hgb 5.4. Received 1 unit PRBCs yesterday evening, with 2 units ordered this morning. 1 of 2 units ordered this morning already completed. Elevated ethanol level at 112. Bilirubin 3.5, AST 120. Significantly hyponatremic with sodium 113.   States he will sometimes see dark stool "when I eat sweets". No hematochezia or hematemesis. Denies abdominal pain. Sometimes feels nauseated in the mornings and tries to make himself vomit but is unsuccessful. Takes Ibuprofen "when I can get them". No aspirin powders. Denies history of IV drug abuse. No mental status changes or confusion, and this is confirmed by sister at bedside as well. States he can "cut down on drinking" and that his friend died from cirrhosis. No prior EGD or colonoscopy. No abdominal imaging on file.   Past Medical History:  Diagnosis Date  . Alcohol abuse     Past Surgical History:  Procedure Laterality Date  . None      Prior to Admission medications   Medication Sig Start Date End Date Taking? Authorizing Provider  aspirin 81 MG chewable tablet Chew 81-324 mg by mouth daily as needed for mild pain or moderate pain.   Yes [provider]  Cyanocobalamin (B-12 PO) Take 2 tablets by mouth daily.   Yes [provider]  diphenhydrAMINE (BENADRYL) 25  MG tablet Take 25 mg by mouth every 6 (six) hours as needed for itching or allergies.   Yes [provider]  famotidine (HEARTBURN RELIEF) 10 MG tablet Take 10 mg by mouth daily as needed for heartburn or indigestion.   Yes [provider]  tetrahydrozoline (EYE DROPS) 0.05 % ophthalmic solution Place 1 drop into both eyes daily.   Yes [provider]    Current Facility-Administered Medications  Medication Dose Route Frequency Provider Last Rate Last Dose  . 0.9 %  sodium chloride infusion   Intravenous Continuous Houston SirenLe, Peter, MD 50 mL/hr at 07/11/16 2215    . 0.9 %  sodium chloride infusion   Intravenous Once Tat, David, MD      . folic acid (FOLVITE) tablet 1 mg  1 mg Oral Daily Houston SirenLe, Peter, MD   1 mg at 07/11/16 2213  . furosemide (LASIX) injection 40 mg  40 mg Intravenous BID Houston SirenLe, Peter, MD   40 mg at 07/12/16 [id number]  . LORazepam (ATIVAN) injection 0-4 mg  0-4 mg Intravenous Q6H Houston SirenLe, Peter, MD   4 mg at 07/12/16 0310   Followed by  . [START ON 07/13/2016] LORazepam (ATIVAN) injection 0-4 mg  0-4 mg Intravenous Q12H Houston SirenLe, Peter, MD      . LORazepam (ATIVAN) tablet 1 mg  1 mg Oral Q6H PRN Houston SirenLe, Peter, MD       Or  . LORazepam (ATIVAN) injection 1 mg  1 mg Intravenous Q6H PRN Houston SirenLe, Peter, MD      .  multivitamin with minerals tablet 1 tablet  1 tablet Oral Daily Houston Siren, MD   1 tablet at 07/11/16 2213  . nicotine (NICODERM CQ - dosed in mg/24 hours) patch 21 mg  21 mg Transdermal Daily Tat, David, MD      . ondansetron [facility]) tablet 4 mg  4 mg Oral Q6H PRN Houston Siren, MD       Or  . ondansetron [facility]) injection 4 mg  4 mg Intravenous Q6H PRN Houston Siren, MD      . pantoprazole (PROTONIX) injection 40 mg  40 mg Intravenous Q12H Houston Siren, MD   40 mg at 07/11/16 2213  . sodium chloride 0.9 % 1,000 mL with thiamine 100 mg, folic acid 1 mg, multivitamins adult 10 mL infusion   Intravenous Once Houston Siren, MD   Stopped at 07/11/16 2130  . sodium chloride flush (NS) 0.9 % injection 3  mL  3 mL Intravenous Q12H Houston Siren, MD   3 mL at 07/11/16 2214  . thiamine (VITAMIN B-1) tablet 100 mg  100 mg Oral Daily Houston Siren, MD   100 mg at 07/11/16 2213   Or  . thiamine (B-1) injection 100 mg  100 mg Intravenous Daily Houston Siren, MD        Allergies as of 07/11/2016 - Review Complete 07/11/2016  Allergen Reaction Noted  . Penicillins  07/11/2016    Family History  Problem Relation Age of Onset  . Colon cancer Neg Hx     Social History   Social History  . Marital status: Single    Spouse name: N/A  . Number of children: N/A  . Years of education: N/A   Occupational History  . unemployed    Social History Main Topics  . Smoking status: Current Every Day Smoker    Packs/day: 1.50    Types: Cigarettes  . Smokeless tobacco: Never Used  . Alcohol use Yes     Comment: 18 beers daily for 20+ years   . Drug use: Yes    Types: Marijuana     Comment: denies history of IV drug abuse   . Sexual activity: Not on file   Other Topics Concern  . Not on file   Social History Narrative  . No narrative on file    Review of Systems: Gen: see HPI  CV: Denies chest pain, heart palpitations, syncope, edema  Resp: Denies shortness of breath with rest, cough, wheezing GI: see HPI  GU : Denies urinary burning, urinary frequency, urinary incontinence.  MS: see HPI  Derm: Denies rash, itching, dry skin Psych: Denies depression, anxiety,confusion, or memory loss Heme: see HPI   Physical Exam: Vital signs in last 24 hours: Temp:  [97.4 F (36.3 C)-98.6 F (37 C)] 98.4 F (36.9 C) (05/29 0820) Pulse Rate:  [93-107] 107 (05/29 0900) Resp:  [17-30] 30 (05/29 0841) BP: (110-143)/(50-74) 136/69 (05/29 0820) SpO2:  [92 %-100 %] 93 % (05/29 0900) Weight:  [242 lb 4.6 oz (109.9 kg)-245 lb 6 oz (111.3 kg)] 242 lb 4.6 oz (109.9 kg) (05/29 0500) Last BM Date: 07/10/16 General:   Alert,  Well-developed, well-nourished, flat affect, sleeping when entering room but easily  awakens Head:  Normocephalic and atraumatic. Eyes:  Mild scleral icterus  Ears:  Normal auditory acuity. Nose:  No deformity, discharge,  or lesions. Mouth:  No deformity or lesions, dentition normal. Lungs:  Expiratory wheezes bilaterally  Heart:  S1 S2 present  Abdomen:  +BS, distended, anasarca, unable to appreciate  HSM due to larger AP diameter. No TTP, rebound, or guarding Rectal:  Deferred until time of colonoscopy.   Msk:  Symmetrical without gross deformities.  Extremities:  2-3+ pitting lower extremity edema to thigh  Neurologic:  Alert and  oriented x4; no asterixis noted  Psych:  Alert and cooperative. Flat affect   Intake/Output from previous day: 05/28 0701 - 05/29 0700 In: 905 [I.V.:287.5; Blood:617.5] Out: 1150 [Urine:1150] Intake/Output this shift: No intake/output data recorded.  Lab Results:  Recent Labs  07/11/16 1731 07/11/16 1848 07/12/16 0652  WBC 9.0 10.0 6.6  HGB 5.4* 5.7* 6.6*  HCT 17.3* 17.9* 21.1*  PLT 166 182 146*   BMET  Recent Labs  07/11/16 1731 07/12/16 0652  NA 113* 118*  K 4.0 3.8  CL 82* 86*  CO2 22 24  GLUCOSE 106* 94  BUN 7 7  CREATININE 0.61 0.51*  CALCIUM 8.4* 8.3*   LFT  Recent Labs  07/11/16 1731  PROT 6.6  ALBUMIN 3.0*  AST 120*  ALT 46  ALKPHOS 106  BILITOT 3.5*   PT/INR  Recent Labs  07/11/16 1731  LABPROT 16.8*  INR 1.35    Studies/Results: Dg Chest 2 View  Result Date: 07/11/2016 CLINICAL DATA:  51 year old male with bilateral lower extremity swelling and shortness of breath. EXAM: CHEST  2 VIEW COMPARISON:  Chest radiograph dated 05/26/2003 FINDINGS: Two views of the chest demonstrate diffuse vascular and interstitial prominence. There are small bilateral pleural effusions. Associated subsegmental atelectatic changes of the lung bases. There is no pneumothorax. Top-normal cardiac size. No acute osseous pathology. IMPRESSION: Small bilateral pleural effusions and diffuse interstitial edema may  represent changes of CHF or fluid fluid. Clinical correlation is recommended. Electronically Signed   By: Elgie Collard M.D.   On: 07/11/2016 18:07    Impression: 51 year old male with history of long-standing alcohol abuse (18 beers a day for at lest 20 years), presenting with profound anemia, decompensation with anasarca, and heme positive. No imaging on file but suspect cirrhosis in the setting of chronic alcohol abuse. Although he notes dark stools chronically prior to admission, no evidence of overt GI bleeding during this admission. Will hold on antibiotics and octreotide at this time.   Alcoholic hepatitis: discriminant function 27, no indication for prednisolone. US abdomen ordered for further assessment.   Profound anemia: with heme positive stool and reports of melena historically. Endorses NSAIDs intermittently. Will need EGD once sodium improves. Does not appear to be an acute variceal bleed.   Profound hyponatremia: improved since admission although still severe. Poor prognostic indicator.   Elevated LFTs: pattern consistent with ETOH abuse. Agree with Hep B and C serologies for now. US abdomen ordered. Paracentesis also ordered per hospitalist. Agree with fluid analysis if fluid is present. Anticipate he is largely presenting with anasarca.    Plan: Continue to monitor H/H, follow for overt GI bleeding Oral BID PPI Full liquids US abdomen today, paracentesis if fluid present with fluid analysis Recheck HFP, INR today No indication for prednisolone at this time Agree with Hep B and C serologies Monitor for withdrawal symptoms Discussed at length with patient alcohol cessation; he does not appear to be ready to pursue this EGD with Propofol in near future once electrolyte status improves Will continue to follow with you  Gelene Mink, PhD, ANP-BC [facility]       LOS: 1 day    07/12/2016, 9:22 AM

## 2016-07-12 NOTE — Progress Notes (Signed)
CRITICAL VALUE ALERT  Critical Value:  Na 118  Date & Time Notied:  07/12/16 0735  Provider Notified: [email or web address] RN  Orders Received/Actions taken: Text paged Dr. Arbutus Leasat.

## 2016-07-12 NOTE — Progress Notes (Signed)
Applied oxygen per N/C at 2 liters due SpO2 at 88-89 %. SpO2 at 93-96 %

## 2016-07-12 NOTE — Progress Notes (Signed)
*  PRELIMINARY RESULTS* Echocardiogram 2D Echocardiogram has been performed.  Stacey DrainWhite, Varvara Legault J 07/12/2016, 4:04 PM

## 2016-07-13 ENCOUNTER — Inpatient Hospital Stay (HOSPITAL_COMMUNITY): Payer: Self-pay

## 2016-07-13 DIAGNOSIS — R7989 Other specified abnormal findings of blood chemistry: Secondary | ICD-10-CM

## 2016-07-13 DIAGNOSIS — R945 Abnormal results of liver function studies: Secondary | ICD-10-CM

## 2016-07-13 LAB — CBC
HEMATOCRIT: 22.9 % — AB (ref 39.0–52.0)
HEMOGLOBIN: 7.5 g/dL — AB (ref 13.0–17.0)
MCH: 26 pg (ref 26.0–34.0)
MCHC: 32.8 g/dL (ref 30.0–36.0)
MCV: 79.5 fL (ref 78.0–100.0)
Platelets: 149 10*3/uL — ABNORMAL LOW (ref 150–400)
RBC: 2.88 MIL/uL — ABNORMAL LOW (ref 4.22–5.81)
RDW: 17.6 % — ABNORMAL HIGH (ref 11.5–15.5)
WBC: 6 10*3/uL (ref 4.0–10.5)

## 2016-07-13 LAB — BPAM RBC
BLOOD PRODUCT EXPIRATION DATE: 201806072359
Blood Product Expiration Date: 201806182359
Blood Product Expiration Date: 201806182359
Blood Product Expiration Date: 201806202359
ISSUE DATE / TIME: 201805290133
ISSUE DATE / TIME: 201805290957
ISSUE DATE / TIME: 201805282248
ISSUE DATE / TIME: 201805291251
UNIT TYPE AND RH: 600
Unit Type and Rh: 600
Unit Type and Rh: 600
Unit Type and Rh: 9500

## 2016-07-13 LAB — BASIC METABOLIC PANEL
ANION GAP: 9 (ref 5–15)
BUN: 7 mg/dL (ref 6–20)
CO2: 26 mmol/L (ref 22–32)
Calcium: 8.1 mg/dL — ABNORMAL LOW (ref 8.9–10.3)
Chloride: 90 mmol/L — ABNORMAL LOW (ref 101–111)
Creatinine, Ser: 0.58 mg/dL — ABNORMAL LOW (ref 0.61–1.24)
GFR calc Af Amer: >60 mL/min (ref >60)
GLUCOSE: 97 mg/dL (ref 65–99)
Potassium: 3.3 mmol/L — ABNORMAL LOW (ref 3.5–5.1)
Sodium: 125 mmol/L — ABNORMAL LOW (ref 135–145)

## 2016-07-13 LAB — ALPHA-1-ANTITRYPSIN: A1 ANTITRYPSIN SER: 222 mg/dL — AB (ref 90–200)

## 2016-07-13 LAB — TYPE AND SCREEN
ABO/RH(D): A NEG
ANTIBODY SCREEN: NEGATIVE
UNIT DIVISION: 0
UNIT DIVISION: 0
Unit division: 0
Unit division: 0

## 2016-07-13 LAB — HIV ANTIBODY (ROUTINE TESTING W REFLEX): HIV SCREEN 4TH GENERATION: NONREACTIVE

## 2016-07-13 LAB — HEPATITIS B SURFACE ANTIGEN: Hepatitis B Surface Ag: NEGATIVE

## 2016-07-13 LAB — HEPATITIS C ANTIBODY: HCV Ab: <0.1 s/co ratio (ref 0.0–0.9)

## 2016-07-13 MED ORDER — IOPAMIDOL (ISOVUE-300) INJECTION 61%
INTRAVENOUS | Status: AC
  Administered 2016-07-13: 12:00:00
  Filled 2016-07-13: qty 30

## 2016-07-13 MED ORDER — IOPAMIDOL (ISOVUE-300) INJECTION 61%
100.0000 mL | Freq: Once | INTRAVENOUS | Status: AC | PRN
  Administered 2016-07-13: 100 mL via INTRAVENOUS

## 2016-07-13 NOTE — Progress Notes (Signed)
CT with cirrhosis. Small ascites. Other findings as outlined in CT report. Anticipate EGD prior to discharge once clinically stable.

## 2016-07-13 NOTE — Progress Notes (Signed)
PROGRESS NOTE    Glenn Black  WUJ:[id number] DOB: 01-24-1966 DOA: 07/11/2016 PCP: Patient, No Pcp Per     Brief Narrative:  51 y/o man admitted to the hospital on 5/28 for abdominal and leg swelling. He has not seen a physician since he was a child, however is an alcoholic and drinks in excess of 18 beers a day. Was found to be hyponatremic and anemic and admission was requested. He was thought on admission to have alcoholic cirrhosis and maybe diastolic CHF (CHF has been disproven based on ECHO results).   Assessment & Plan:   Principal Problem:   Alcoholic cirrhosis of liver with ascites (HCC) Active Problems:   Hyponatremia   GI bleeding   Anemia   Anasarca   Alcohol abuse   GI bleed   Tobacco abuse   Cannabis use disorder, moderate, dependence (HCC)   Acute blood loss anemia   Pleural effusion   Peripheral edema   Elevated LFTs   Decompensated Presumptive Alcoholic Cirrhosis of the Liver -With also possibly acute alcoholic hepatitis. Discriminant factor on admission was 27, hence no need for steroids. -Is for CT scan of abdomen today, per GI recommendations. -Na has improved to 125; suspect he has some degree of chronic hyponatremia due to beer potomania and decompensated cirrhosis. -Still markedly volume overloaded on exam; doubt accuracy of documented Is and Os. -Continue lasix at current dose of 40 mg IV BID. -Was thought to maybe have acute diastolic CHF but ECHO with EF 95-62%, normal LV diastolic function parameters and no WMA.  ETOH Abuse -Thiamine/folate/MVI. -Counseled on cessation. -CIWAs have been 12-14 today. Seems a bit disoriented. Today is day 3. Suspect he will be going into DTs soon, and as such will keep in the SDU today.  Hyponatremia -Suspect due to beer potomania and decompensated cirrhosis of the liver. -Na has improved to 125. -Will DC IVF for now, given marked volume overload.  Anemia, presumably of Chronic Disease and possibly a  component of chronic blood loss -Hb was 5.7 on admission. -Has received 4 units of PRBCs and Hb is only 7.5 today. -Will not transfuse further unless Hb <7. -No signs of active bleeding while hospitalized. -Further EGD/Colonoscopy at discretion of GI.  Tobacco Abuse -Counseled on cessation. -nicotine patch.  Elevated TSH -With a normal fre T4. -Would retest in 4-6 weeks. -Would not advocate initiating synthroid at this time. -Likely sick euthyroid.   DVT prophylaxis: SCDs Code Status: full code Family Communication: patient only Disposition Plan: keep in SDU today given possibility of DTs  Consultants:   GI  Procedures:   None  Antimicrobials:  Anti-infectives    None       Subjective: States he feels better, lying in bed eating breakfast.  Objective: Vitals:   07/13/16 0500 07/13/16 0600 07/13/16 0700 07/13/16 0721  BP: 121/63 109/60 (!) 118/57   Pulse: 93 91 91 96  Resp: (!) 24 (!) 22  (!) 23  Temp:    98.2 F (36.8 C)  TempSrc:    Oral  SpO2: 98% 97%  99%  Weight: 105.5 kg (232 lb 9.4 oz)     Height:        Intake/Output Summary (Last 24 hours) at 07/13/16 0855 Last data filed at 07/13/16 0600  Gross per 24 hour  Intake          2362.07 ml  Output              475 ml  Net  1887.07 ml   Filed Weights   07/11/16 2108 07/12/16 0500 07/13/16 0500  Weight: 111.3 kg (245 lb 6 oz) 109.9 kg (242 lb 4.6 oz) 105.5 kg (232 lb 9.4 oz)    Examination:  General exam: Awake, oriented to person only. Respiratory system: Clear to auscultation. Altho decreased bilateral BS Cardiovascular system:RRR. No murmurs, rubs, gallops. Gastrointestinal system: Abdomen is distended, positive fluid wave Central nervous system: Alert and oriented. No focal neurological deficits. Extremities: 3-4++ bilateral pedal edema Skin: No rashes, lesions or ulcers Psychiatry:  Mood & affect appropriate.     Data Reviewed: I have personally reviewed following labs and  imaging studies  CBC:  Recent Labs Lab 07/11/16 1731 07/11/16 1848 07/12/16 0652 07/13/16 0435  WBC 9.0 10.0 6.6 6.0  NEUTROABS 7.6  --   --   --   HGB 5.4* 5.7* 6.6* 7.5*  HCT 17.3* 17.9* 21.1* 22.9*  MCV 76.9* 76.8* 79.3 79.5  PLT 166 182 146* 149*   Basic Metabolic Panel:  Recent Labs Lab 07/11/16 1731 07/11/16 1751 07/12/16 0652 07/12/16 1534 07/12/16 1919 07/13/16 0435  NA 113*  --  118* 120* 121* 125*  K 4.0  --  3.8 3.9 4.0 3.3*  CL 82*  --  86* 87* 87* 90*  CO2 22  --  24 25 24 26   GLUCOSE 106*  --  94 108* 109* 97  BUN 7  --  7 7 7 7   CREATININE 0.61  --  0.51* 0.50* 0.63 0.58*  CALCIUM 8.4*  --  8.3* 8.1* 8.4* 8.1*  MG  --  1.9  --   --   --   --    GFR: Estimated Creatinine Clearance: 140.3 mL/min (A) (by C-G formula based on SCr of 0.58 mg/dL (L)). Liver Function Tests:  Recent Labs Lab 07/11/16 1731 07/12/16 1534  AST 120* 123*  ALT 46 46  ALKPHOS 106 100  BILITOT 3.5* 7.4*  PROT 6.6 6.6  ALBUMIN 3.0* 3.0*   No results for input(s): LIPASE, AMYLASE in the last 168 hours.  Recent Labs Lab 07/11/16 2018  AMMONIA 20   Coagulation Profile:  Recent Labs Lab 07/11/16 1731 07/12/16 1534  INR 1.35 1.29   Cardiac Enzymes: No results for input(s): CKTOTAL, CKMB, CKMBINDEX, TROPONINI in the last 168 hours. BNP (last 3 results) No results for input(s): PROBNP in the last 8760 hours. HbA1C: No results for input(s): HGBA1C in the last 72 hours. CBG: No results for input(s): GLUCAP in the last 168 hours. Lipid Profile: No results for input(s): CHOL, HDL, LDLCALC, TRIG, CHOLHDL, LDLDIRECT in the last 72 hours. Thyroid Function Tests:  Recent Labs  07/11/16 1751 07/12/16 1534  TSH 6.321*  --   FREET4  --  0.80   Anemia Panel:  Recent Labs  07/12/16 1534  VITAMINB12 1,337*  FOLATE 20.5  FERRITIN 46  TIBC 302  IRON 230*   Urine analysis:    Component Value Date/Time   COLORURINE YELLOW 07/12/2016 0820   APPEARANCEUR CLEAR  07/12/2016 0820   LABSPEC 1.005 07/12/2016 0820   PHURINE 6.0 07/12/2016 0820   GLUCOSEU NEGATIVE 07/12/2016 0820   HGBUR SMALL (A) 07/12/2016 0820   BILIRUBINUR NEGATIVE 07/12/2016 0820   KETONESUR NEGATIVE 07/12/2016 0820   PROTEINUR NEGATIVE 07/12/2016 0820   NITRITE NEGATIVE 07/12/2016 0820   LEUKOCYTESUR NEGATIVE 07/12/2016 0820   Sepsis Labs: @LABRCNTIP (procalcitonin:4,lacticidven:4)  ) Recent Results (from the past 240 hour(s))  MRSA PCR Screening     Status: None  Collection Time: 07/11/16  9:15 PM  Result Value Ref Range Status   MRSA by PCR NEGATIVE NEGATIVE Final    Comment:        The GeneXpert MRSA Assay (FDA approved for NASAL specimens only), is one component of a comprehensive MRSA colonization surveillance program. It is not intended to diagnose MRSA infection nor to guide or monitor treatment for MRSA infections.          Radiology Studies: Dg Chest 2 View  Result Date: 07/11/2016 CLINICAL DATA:  51 year old male with bilateral lower extremity swelling and shortness of breath. EXAM: CHEST  2 VIEW COMPARISON:  Chest radiograph dated 05/26/2003 FINDINGS: Two views of the chest demonstrate diffuse vascular and interstitial prominence. There are small bilateral pleural effusions. Associated subsegmental atelectatic changes of the lung bases. There is no pneumothorax. Top-normal cardiac size. No acute osseous pathology. IMPRESSION: Small bilateral pleural effusions and diffuse interstitial edema may represent changes of CHF or fluid fluid. Clinical correlation is recommended. Electronically Signed   By: Elgie Collard M.D.   On: 07/11/2016 18:07   US Abdomen Complete  Result Date: 07/12/2016 CLINICAL DATA:  Hepatic cirrhosis, elevated liver function studies, anasarca, initial visit. The patient is not NPO. EXAM: ABDOMEN ULTRASOUND COMPLETE COMPARISON:  None in PACs FINDINGS: Gallbladder: The gallbladder is adequately distended. The gallbladder wall is  subjectively mildly thickened but this may reflect the non NPO status. There is no pericholecystic fluid. There is no positive sonographic Murphy's sign. No stones or sludge are observed. Common bile duct: Diameter: The common bile duct could not be visualized. Liver: Evaluation of the liver was limited due to the patient's body habitus, subcutaneous edema, and limited sonographic window. The hepatic echotexture is increased. A small amount of ascites is observed IVC: Nonvisualized. Pancreas: Nonvisualized. Spleen: 10.5 cm in length. Right Kidney: Difficulty to visualize. Length: 11.1 cm. Echogenicity within normal limits. No mass or hydronephrosis visualized. Left Kidney: Length: 10.9 cm. Echogenicity within normal limits. No mass or hydronephrosis visualized. Abdominal aorta: Not visualized. Other findings: None. IMPRESSION: The study is quite limited due to the patient's body habitus, soft tissue edema, and limited acoustic windows. Increased hepatic echotexture is noted but complete evaluation of the liver was not possible. Mild gallbladder wall thickening may reflect the nonfasting state and a small amount of ascites present. There is no splenomegaly. Limited or nonvisualization of the common bile duct, abdominal aorta, IVC, pancreas, and right kidney. Electronically Signed   By: David  [place] M.D.   On: 07/12/2016 10:15   US Venous Img Lower Bilateral  Result Date: 07/12/2016 CLINICAL DATA:  Bilateral lower extremity pain and edema. EXAM: BILATERAL LOWER EXTREMITY VENOUS DOPPLER ULTRASOUND TECHNIQUE: Gray-scale sonography with graded compression, as well as color Doppler and duplex ultrasound were performed to evaluate the lower extremity deep venous systems from the level of the common femoral vein and including the common femoral, femoral, profunda femoral, popliteal and calf veins including the posterior tibial, peroneal and gastrocnemius veins when visible. The superficial great saphenous vein was also  interrogated. Spectral Doppler was utilized to evaluate flow at rest and with distal augmentation maneuvers in the common femoral, femoral and popliteal veins. COMPARISON:  None. FINDINGS: RIGHT LOWER EXTREMITY Common Femoral Vein: No evidence of thrombus. Normal compressibility, respiratory phasicity and response to augmentation. Saphenofemoral Junction: No evidence of thrombus. Normal compressibility and flow on color Doppler imaging. Profunda Femoral Vein: No evidence of thrombus. Normal compressibility and flow on color Doppler imaging. Femoral Vein: No evidence of  thrombus. Normal compressibility, respiratory phasicity and response to augmentation. Popliteal Vein: No evidence of thrombus. Normal compressibility, respiratory phasicity and response to augmentation. Calf Veins: No evidence of thrombus. Normal compressibility and flow on color Doppler imaging. Superficial Great Saphenous Vein: No evidence of thrombus. Normal compressibility and flow on color Doppler imaging. Venous Reflux:  None. Other Findings:  Mild edema is noted in the ankles. LEFT LOWER EXTREMITY Common Femoral Vein: No evidence of thrombus. Normal compressibility, respiratory phasicity and response to augmentation. Saphenofemoral Junction: No evidence of thrombus. Normal compressibility and flow on color Doppler imaging. Profunda Femoral Vein: No evidence of thrombus. Normal compressibility and flow on color Doppler imaging. Femoral Vein: No evidence of thrombus. Normal compressibility, respiratory phasicity and response to augmentation. Popliteal Vein: No evidence of thrombus. Normal compressibility, respiratory phasicity and response to augmentation. Calf Veins: No evidence of thrombus. Normal compressibility and flow on color Doppler imaging. Superficial Great Saphenous Vein: No evidence of thrombus. Normal compressibility and flow on color Doppler imaging. Venous Reflux:  None. Other Findings:  Mild edema is noted in the ankles.  IMPRESSION: No evidence of DVT within either lower extremity. Mild soft tissue edema seen in both ankles. Electronically Signed   By: Lupita RaiderJames  Green Jr, M.D.   On: 07/12/2016 15:15        Scheduled Meds: . chlordiazePOXIDE  50 mg Oral QID  . folic acid  1 mg Oral Daily  . furosemide  40 mg Intravenous BID  . LORazepam  0-4 mg Intravenous Q6H   Followed by  . LORazepam  0-4 mg Intravenous Q12H  . multivitamin with minerals  1 tablet Oral Daily  . nicotine  21 mg Transdermal Daily  . pantoprazole  40 mg Oral BID AC  . sodium chloride flush  3 mL Intravenous Q12H  . thiamine  100 mg Oral Daily   Or  . thiamine  100 mg Intravenous Daily   Continuous Infusions: . sodium chloride 50 mL/hr at 07/13/16 0416  . banana bag IV 1000 mL Stopped (07/11/16 2130)     LOS: 2 days    Time spent: 35 minutes. Greater than 50% of this time was spent in direct contact with the patient coordinating care.     Chaya JanHERNANDEZ ACOSTA,ESTELA, MD [facility] Pager [phone]  If 7PM-7AM, please contact night-coverage [email or web address] Password [email or web address] 07/13/2016, 8:55 AM

## 2016-07-13 NOTE — Progress Notes (Signed)
Subjective: No abdominal pain, N/V, overt GI bleeding. No confusion. States he is hungry.   Objective: Vital signs in last 24 hours: Temp:  [97.6 F (36.4 C)-99.3 F (37.4 C)] 98.2 F (36.8 C) (05/30 0721) Pulse Rate:  [90-107] 96 (05/30 0721) Resp:  [17-30] 23 (05/30 0721) BP: (101-143)/(51-80) 109/60 (05/30 0600) SpO2:  [72 %-99 %] 99 % (05/30 0721) Weight:  [232 lb 9.4 oz (105.5 kg)] 232 lb 9.4 oz (105.5 kg) (05/30 0500) Last BM Date: 07/12/16 General:   Sleeping upon entering but easily awakens.  Eyes:  +scleral icterus  Abdomen:  Full, distended but non-tense, anasarca noted, improved from yesterday. +BS. No rebound or guarding.  Extremities:  With 2+ pitting lower extremity edema to thigh, slightly improved from yesterday Neurologic:  Alert and  oriented x4;  No asterixis  Psych:  Normal mood and affect.  Intake/Output from previous day: 05/29 0701 - 05/30 0700 In: 2602.1 [P.O.:720; I.V.:1300; Blood:582.1] Out: 475 [Urine:475] Intake/Output this shift: No intake/output data recorded.  Lab Results:  Recent Labs  07/11/16 1848 07/12/16 0652 07/13/16 0435  WBC 10.0 6.6 6.0  HGB 5.7* 6.6* 7.5*  HCT 17.9* 21.1* 22.9*  PLT 182 146* 149*   BMET  Recent Labs  07/12/16 1534 07/12/16 1919 07/13/16 0435  NA 120* 121* 125*  K 3.9 4.0 3.3*  CL 87* 87* 90*  CO2 25 24 26   GLUCOSE 108* 109* 97  BUN 7 7 7   CREATININE 0.50* 0.63 0.58*  CALCIUM 8.1* 8.4* 8.1*   LFT  Recent Labs  07/11/16 1731 07/12/16 1534  PROT 6.6 6.6  ALBUMIN 3.0* 3.0*  AST 120* 123*  ALT 46 46  ALKPHOS 106 100  BILITOT 3.5* 7.4*  BILIDIR  --  2.3*  IBILI  --  5.1*   PT/INR  Recent Labs  07/11/16 1731 07/12/16 1534  LABPROT 16.8* 16.2*  INR 1.35 1.29   Hepatitis Panel  Recent Labs  07/12/16 1534  HEPBSAG Negative  HCVAB <0.1    Studies/Results: Dg Chest 2 View  Result Date: 07/11/2016 CLINICAL DATA:  51 year old male with bilateral lower extremity swelling and  shortness of breath. EXAM: CHEST  2 VIEW COMPARISON:  Chest radiograph dated 05/26/2003 FINDINGS: Two views of the chest demonstrate diffuse vascular and interstitial prominence. There are small bilateral pleural effusions. Associated subsegmental atelectatic changes of the lung bases. There is no pneumothorax. Top-normal cardiac size. No acute osseous pathology. IMPRESSION: Small bilateral pleural effusions and diffuse interstitial edema may represent changes of CHF or fluid fluid. Clinical correlation is recommended. Electronically Signed   By: Elgie Collard M.D.   On: 07/11/2016 18:07   US Abdomen Complete  Result Date: 07/12/2016 CLINICAL DATA:  Hepatic cirrhosis, elevated liver function studies, anasarca, initial visit. The patient is not NPO. EXAM: ABDOMEN ULTRASOUND COMPLETE COMPARISON:  None in PACs FINDINGS: Gallbladder: The gallbladder is adequately distended. The gallbladder wall is subjectively mildly thickened but this may reflect the non NPO status. There is no pericholecystic fluid. There is no positive sonographic Murphy's sign. No stones or sludge are observed. Common bile duct: Diameter: The common bile duct could not be visualized. Liver: Evaluation of the liver was limited due to the patient's body habitus, subcutaneous edema, and limited sonographic window. The hepatic echotexture is increased. A small amount of ascites is observed IVC: Nonvisualized. Pancreas: Nonvisualized. Spleen: 10.5 cm in length. Right Kidney: Difficulty to visualize. Length: 11.1 cm. Echogenicity within normal limits. No mass or hydronephrosis visualized. Left Kidney: Length:  10.9 cm. Echogenicity within normal limits. No mass or hydronephrosis visualized. Abdominal aorta: Not visualized. Other findings: None. IMPRESSION: The study is quite limited due to the patient's body habitus, soft tissue edema, and limited acoustic windows. Increased hepatic echotexture is noted but complete evaluation of the liver was not  possible. Mild gallbladder wall thickening may reflect the nonfasting state and a small amount of ascites present. There is no splenomegaly. Limited or nonvisualization of the common bile duct, abdominal aorta, IVC, pancreas, and right kidney. Electronically Signed   By: David  SwazilandJordan M.D.   On: 07/12/2016 10:15   [place] Venous Img Lower Bilateral  Result Date: 07/12/2016 CLINICAL DATA:  Bilateral lower extremity pain and edema. EXAM: BILATERAL LOWER EXTREMITY VENOUS DOPPLER ULTRASOUND TECHNIQUE: Gray-scale sonography with graded compression, as well as color Doppler and duplex ultrasound were performed to evaluate the lower extremity deep venous systems from the level of the common femoral vein and including the common femoral, femoral, profunda femoral, popliteal and calf veins including the posterior tibial, peroneal and gastrocnemius veins when visible. The superficial great saphenous vein was also interrogated. Spectral Doppler was utilized to evaluate flow at rest and with distal augmentation maneuvers in the common femoral, femoral and popliteal veins. COMPARISON:  None. FINDINGS: RIGHT LOWER EXTREMITY Common Femoral Vein: No evidence of thrombus. Normal compressibility, respiratory phasicity and response to augmentation. Saphenofemoral Junction: No evidence of thrombus. Normal compressibility and flow on color Doppler imaging. Profunda Femoral Vein: No evidence of thrombus. Normal compressibility and flow on color Doppler imaging. Femoral Vein: No evidence of thrombus. Normal compressibility, respiratory phasicity and response to augmentation. Popliteal Vein: No evidence of thrombus. Normal compressibility, respiratory phasicity and response to augmentation. Calf Veins: No evidence of thrombus. Normal compressibility and flow on color Doppler imaging. Superficial Great Saphenous Vein: No evidence of thrombus. Normal compressibility and flow on color Doppler imaging. Venous Reflux:  None. Other Findings:   Mild edema is noted in the ankles. LEFT LOWER EXTREMITY Common Femoral Vein: No evidence of thrombus. Normal compressibility, respiratory phasicity and response to augmentation. Saphenofemoral Junction: No evidence of thrombus. Normal compressibility and flow on color Doppler imaging. Profunda Femoral Vein: No evidence of thrombus. Normal compressibility and flow on color Doppler imaging. Femoral Vein: No evidence of thrombus. Normal compressibility, respiratory phasicity and response to augmentation. Popliteal Vein: No evidence of thrombus. Normal compressibility, respiratory phasicity and response to augmentation. Calf Veins: No evidence of thrombus. Normal compressibility and flow on color Doppler imaging. Superficial Great Saphenous Vein: No evidence of thrombus. Normal compressibility and flow on color Doppler imaging. Venous Reflux:  None. Other Findings:  Mild edema is noted in the ankles. IMPRESSION: No evidence of DVT within either lower extremity. Mild soft tissue edema seen in both ankles. Electronically Signed   By: Lupita RaiderJames  Green Jr, M.D.   On: 07/12/2016 15:15    Assessment: 51 year old male with history of long-standing alcohol abuse (18 beers a day for at least 20 years), presenting with profound anemia, decompensation with anasarca, and heme positive. US abdomen limited yesterday. Although he notes dark stools chronically prior to admission, no evidence of overt GI bleeding during this admission. Continues to diuresis, down 13 lbs since admission.   Alcoholic hepatitis: discriminant function 27 on admission, no indication for prednisolone. Yesterday DF 29. US abdomen limited view. Per plan, proceed with CT abdomen today.   Profound anemia: with heme positive stool and reports of melena historically. 4 units PRBCs this admission with Hgb improving to 7.5.  Endorses  NSAIDs intermittently. Will need EGD/TCS once clinically stable and hyponatremia improved. Anticipate procedures prior to  discharge with Propofol.   Profound hyponatremia: improving since admission.   Elevated LFTs: pattern consistent with ETOH abuse. Hep B surface antigen negative, Hep C antibody negative. May see rise in bilirubin before plateau. CT abd ordered for today. INR stable. Follow HFP/INR, recheck in am.   Plan: CT abd today for better evaluation of liver morphology due to limited abdominal ultrasound CMP, CBC, INR in am PPI oral BID  Colonoscopy/EGD with Propofol prior to discharge ETOH cessation Dysphagia 3 diet   Gelene Mink, PhD, ANP-BC [facility]    LOS: 2 days    07/13/2016, 7:42 AM

## 2016-07-14 DIAGNOSIS — F1010 Alcohol abuse, uncomplicated: Secondary | ICD-10-CM

## 2016-07-14 DIAGNOSIS — D649 Anemia, unspecified: Secondary | ICD-10-CM

## 2016-07-14 DIAGNOSIS — M79604 Pain in right leg: Secondary | ICD-10-CM

## 2016-07-14 DIAGNOSIS — E871 Hypo-osmolality and hyponatremia: Secondary | ICD-10-CM

## 2016-07-14 DIAGNOSIS — M79606 Pain in leg, unspecified: Secondary | ICD-10-CM

## 2016-07-14 DIAGNOSIS — M79605 Pain in left leg: Secondary | ICD-10-CM

## 2016-07-14 LAB — COMPREHENSIVE METABOLIC PANEL
ALT: 45 U/L (ref 17–63)
AST: 121 U/L — ABNORMAL HIGH (ref 15–41)
Albumin: 2.7 g/dL — ABNORMAL LOW (ref 3.5–5.0)
Alkaline Phosphatase: 102 U/L (ref 38–126)
Anion gap: 9 (ref 5–15)
BUN: 7 mg/dL (ref 6–20)
CHLORIDE: 92 mmol/L — AB (ref 101–111)
CO2: 28 mmol/L (ref 22–32)
Calcium: 8.2 mg/dL — ABNORMAL LOW (ref 8.9–10.3)
Creatinine, Ser: 0.58 mg/dL — ABNORMAL LOW (ref 0.61–1.24)
Glucose, Bld: 101 mg/dL — ABNORMAL HIGH (ref 65–99)
POTASSIUM: 3 mmol/L — AB (ref 3.5–5.1)
Sodium: 129 mmol/L — ABNORMAL LOW (ref 135–145)
Total Bilirubin: 3.7 mg/dL — ABNORMAL HIGH (ref 0.3–1.2)
Total Protein: 6.1 g/dL — ABNORMAL LOW (ref 6.5–8.1)

## 2016-07-14 LAB — CBC WITH DIFFERENTIAL/PLATELET
Basophils Absolute: 0.1 10*3/uL (ref 0.0–0.1)
Basophils Relative: 1 %
Eosinophils Absolute: 0.1 10*3/uL (ref 0.0–0.7)
Eosinophils Relative: 1 %
HCT: 23.2 % — ABNORMAL LOW (ref 39.0–52.0)
HEMOGLOBIN: 7.3 g/dL — AB (ref 13.0–17.0)
LYMPHS ABS: 0.7 10*3/uL (ref 0.7–4.0)
LYMPHS PCT: 11 %
MCH: 25.5 pg — AB (ref 26.0–34.0)
MCHC: 31.5 g/dL (ref 30.0–36.0)
MCV: 81.1 fL (ref 78.0–100.0)
Monocytes Absolute: 0.6 10*3/uL (ref 0.1–1.0)
Monocytes Relative: 10 %
NEUTROS PCT: 77 %
Neutro Abs: 4.7 10*3/uL (ref 1.7–7.7)
Platelets: 162 10*3/uL (ref 150–400)
RBC: 2.86 MIL/uL — AB (ref 4.22–5.81)
RDW: 18 % — ABNORMAL HIGH (ref 11.5–15.5)
WBC: 6.2 10*3/uL (ref 4.0–10.5)

## 2016-07-14 LAB — PROTIME-INR
INR: 1.42
PROTHROMBIN TIME: 17.5 s — AB (ref 11.4–15.2)

## 2016-07-14 MED ORDER — POTASSIUM CHLORIDE CRYS ER 20 MEQ PO TBCR
40.0000 meq | EXTENDED_RELEASE_TABLET | ORAL | Status: AC
  Administered 2016-07-14 (×2): 40 meq via ORAL
  Filled 2016-07-14 (×2): qty 2

## 2016-07-14 MED ORDER — SODIUM CHLORIDE 0.9 % IV SOLN
INTRAVENOUS | Status: DC
  Administered 2016-07-14: 14:00:00 via INTRAVENOUS

## 2016-07-14 NOTE — Progress Notes (Signed)
Noted patient has dried blood on bottom lip and chin. Patient denies biting lip and no further blood noted with assessment.
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
## 2016-07-14 NOTE — Progress Notes (Signed)
Subjective: Minimal to no abdominal pain. Feels his edema is improved. Has not had any significant bleeding in his bowels since admission. No other GI complaints.  Objective: Vital signs in last 24 hours: Temp:  [97.6 F (36.4 C)-99 F (37.2 C)] 99 F (37.2 C) (05/31 1105) Pulse Rate:  [92-102] 95 (05/31 1105) Resp:  [20-32] 22 (05/31 1105) BP: (80-124)/(30-61) 106/52 (05/31 0930) SpO2:  [90 %-98 %] 94 % (05/31 1105) Weight:  [240 lb 4.8 oz (109 kg)] 240 lb 4.8 oz (109 kg) (05/31 0500) Last BM Date: 07/12/16 General:   Alert and oriented x 3, pleasant Head:  Normocephalic and atraumatic. Eyes:  No icterus, sclera clear. Conjuctiva pink.  Heart:  S1, S2 present, no murmurs noted.  Lungs: Clear to auscultation bilaterally, without wheezing, rales, or rhonchi.  Abdomen:  Bowel sounds present, rounded and firm but no tense ascites. Non-tender, non-distended. No HSM or hernias noted. No rebound or guarding. No masses appreciated  Msk:  Symmetrical without gross deformities. Pulses:  Normal bilateral pedal pulses noted. Extremities:  Without clubbing. Noted 2+ LE edema bilaterally. Neurologic:  Alert and  oriented x3;  Seems somewhat pleasently confused but is oriented x3 Skin:  Warm and dry, intact without significant lesions.  Psych:  Alert and cooperative. Normal mood and affect.  Intake/Output from previous day: 05/30 0701 - 05/31 0700 In: 480 [P.O.:480] Out: 2800 [Urine:2800] Intake/Output this shift: No intake/output data recorded.  Lab Results:  Recent Labs  07/12/16 0652 07/13/16 0435 07/14/16 0519  WBC 6.6 6.0 6.2  HGB 6.6* 7.5* 7.3*  HCT 21.1* 22.9* 23.2*  PLT 146* 149* 162   BMET  Recent Labs  07/12/16 1919 07/13/16 0435 07/14/16 0519  NA 121* 125* 129*  K 4.0 3.3* 3.0*  CL 87* 90* 92*  CO2 24 26 28   GLUCOSE 109* 97 101*  BUN 7 7 7   CREATININE 0.63 0.58* 0.58*  CALCIUM 8.4* 8.1* 8.2*   LFT  Recent Labs  07/11/16 1731 07/12/16 1534  07/14/16 0519  PROT 6.6 6.6 6.1*  ALBUMIN 3.0* 3.0* 2.7*  AST 120* 123* 121*  ALT 46 46 45  ALKPHOS 106 100 102  BILITOT 3.5* 7.4* 3.7*  BILIDIR  --  2.3*  --   IBILI  --  5.1*  --    PT/INR  Recent Labs  07/12/16 1534 07/14/16 0519  LABPROT 16.2* 17.5*  INR 1.29 1.42   Hepatitis Panel  Recent Labs  07/12/16 1534  HEPBSAG Negative  HCVAB <0.1     Studies/Results: Ct Abdomen W Contrast  Result Date: 07/13/2016 CLINICAL DATA:  Lower leg swelling, elevated liver function tests. EXAM: CT ABDOMEN WITH CONTRAST TECHNIQUE: Multidetector CT imaging of the abdomen was performed using the standard protocol following bolus administration of intravenous contrast. CONTRAST:  ISOVUE-300 IOPAMIDOL (ISOVUE-300) INJECTION 61% COMPARISON:  None. FINDINGS: Lower chest: Moderate bilateral pleural effusions with compressive atelectasis in both lower lobes. Heart is enlarged. Coronary artery calcification. No pericardial effusion. Distal esophageal varices. Hepatobiliary: Liver margin is irregular. 2.0 cm low-attenuation lesion in the medial aspect of the right hepatic lobe is likely a cyst. Gallbladder is unremarkable. No biliary ductal dilatation. Pancreas: Negative. Spleen: Measures approximately 14.9 cm, otherwise unremarkable. Adrenals/Urinary Tract: Right adrenal gland is unremarkable. There may be slight thickening of the left adrenal gland. Kidneys are unremarkable. Stomach/Bowel: Stomach and visualized portions of the small bowel and colon are grossly unremarkable. Vascular/Lymphatic: Atherosclerotic calcification of the arterial vasculature without abdominal aortic aneurysm. Recanalized paraumbilical  vein. Gastroesophageal varices, as mentioned previously. Periportal lymph nodes measure up to 1.4 cm. Gastrohepatic ligament lymph nodes measure up to 11 mm. Other: Scattered ascites. Visualize mesenteries and peritoneum are otherwise unremarkable. Musculoskeletal: Degenerative changes in the  spine. No worrisome lytic or sclerotic lesions. IMPRESSION: 1. Cirrhosis with portal hypertension as evidenced by gastroesophageal varices and splenomegaly. 2. Small ascites. 3. Borderline enlarged upper abdominal lymph nodes, likely reactive. 4. Moderate bilateral pleural effusions with compressive atelectasis in both lower lobes. 5.  Aortic atherosclerosis (ICD10-170.0). Electronically Signed   By: Leanna Battles M.D.   On: 07/13/2016 12:35   US Venous Img Lower Bilateral  Result Date: 07/12/2016 CLINICAL DATA:  Bilateral lower extremity pain and edema. EXAM: BILATERAL LOWER EXTREMITY VENOUS DOPPLER ULTRASOUND TECHNIQUE: Gray-scale sonography with graded compression, as well as color Doppler and duplex ultrasound were performed to evaluate the lower extremity deep venous systems from the level of the common femoral vein and including the common femoral, femoral, profunda femoral, popliteal and calf veins including the posterior tibial, peroneal and gastrocnemius veins when visible. The superficial great saphenous vein was also interrogated. Spectral Doppler was utilized to evaluate flow at rest and with distal augmentation maneuvers in the common femoral, femoral and popliteal veins. COMPARISON:  None. FINDINGS: RIGHT LOWER EXTREMITY Common Femoral Vein: No evidence of thrombus. Normal compressibility, respiratory phasicity and response to augmentation. Saphenofemoral Junction: No evidence of thrombus. Normal compressibility and flow on color Doppler imaging. Profunda Femoral Vein: No evidence of thrombus. Normal compressibility and flow on color Doppler imaging. Femoral Vein: No evidence of thrombus. Normal compressibility, respiratory phasicity and response to augmentation. Popliteal Vein: No evidence of thrombus. Normal compressibility, respiratory phasicity and response to augmentation. Calf Veins: No evidence of thrombus. Normal compressibility and flow on color Doppler imaging. Superficial Great  Saphenous Vein: No evidence of thrombus. Normal compressibility and flow on color Doppler imaging. Venous Reflux:  None. Other Findings:  Mild edema is noted in the ankles. LEFT LOWER EXTREMITY Common Femoral Vein: No evidence of thrombus. Normal compressibility, respiratory phasicity and response to augmentation. Saphenofemoral Junction: No evidence of thrombus. Normal compressibility and flow on color Doppler imaging. Profunda Femoral Vein: No evidence of thrombus. Normal compressibility and flow on color Doppler imaging. Femoral Vein: No evidence of thrombus. Normal compressibility, respiratory phasicity and response to augmentation. Popliteal Vein: No evidence of thrombus. Normal compressibility, respiratory phasicity and response to augmentation. Calf Veins: No evidence of thrombus. Normal compressibility and flow on color Doppler imaging. Superficial Great Saphenous Vein: No evidence of thrombus. Normal compressibility and flow on color Doppler imaging. Venous Reflux:  None. Other Findings:  Mild edema is noted in the ankles. IMPRESSION: No evidence of DVT within either lower extremity. Mild soft tissue edema seen in both ankles. Electronically Signed   By: Lupita Raider, M.D.   On: 07/12/2016 15:15    Assessment: 51 year old male with history of long-standing alcohol abuse (18 beers a day for at least 20 years), presenting with profound anemia, decompensation with anasarca, and heme positive. US abdomen limited yesterday. Although he notes dark stools chronically prior to admission, no evidence of overt GI bleeding during this admission. Continues to diuresis, down 5 lbs since admission.   Alcoholic hepatitis: discriminant function 27 on admission, no indication for prednisolone. 07/12/16 DF 29. US abdomen limited view. CT found cirrhosis with portal hypertension as evidenced by gastroesophageal varices and splenomegaly, small amount of ascites, other findings as per above.   Profound anemia: with  heme positive stool  and reports of melena historically. 4 units PRBCs this admission with Hgb improving to 7.5 yesterday and stable at 7.3 today.  Endorses NSAIDs intermittently.    Profound hyponatremia: improving since admission.   Elevated LFTs: pattern consistent with ETOH abuse. Hep B surface antigen negative, Hep C antibody negative. May see rise in bilirubin before plateau. CT abd ordered for today. INR stable. Follow HFP/INR, recheck in am.   Today his labs continue to improve with stable hgb 7.3, plt at 162, Na 129 (125 yesterday), AST stable, bili improved to 3.7 today (7.2 two days ago), INR stable at 1.42. MELD score = 22; Child-Pugh C. He is not in DTs (his CIWA score is low today her hospitalist). No further GI bleeding (differentials include portal gastropathy, esophagitis, gastritis, gastric erosion, gastric ulcer, duodenal ulcer all in the setting of liver disease/elevated INR; less likely esophageal varices)   Plan: 1. ETOH cessation 2. Plan EGD tomorrow on propofol/prior to discharge 3. Likely need of colonoscopy as outpatient 4. Check CMP 6.2 (Saturday) 5. Will need outpatient follow-up related to ETOH cirrhosis 6. Supportive measures   Thank you for allowing us to participate in the care of Glenn Black  Eric Gill, DNP, AGNP-C Adult & Gerontological Nurse Practitioner [facility]     LOS: 3 days    07/14/2016, 1:46 PM

## 2016-07-14 NOTE — Progress Notes (Signed)
Patient transferring to Dept 300 room# 331 on telemetry, report given to receiving nurse Amy, RN.

## 2016-07-15 ENCOUNTER — Inpatient Hospital Stay (HOSPITAL_COMMUNITY): Payer: Self-pay | Admitting: Anesthesiology

## 2016-07-15 ENCOUNTER — Encounter (HOSPITAL_COMMUNITY): Payer: Self-pay | Admitting: *Deleted

## 2016-07-15 ENCOUNTER — Encounter (HOSPITAL_COMMUNITY)
Admission: EM | Disposition: A | Discharge status: Home / Self Care | Payer: Self-pay | Source: Home / Self Care | Attending: Internal Medicine

## 2016-07-15 HISTORY — PX: ESOPHAGOGASTRODUODENOSCOPY (EGD) WITH PROPOFOL: SHX5813

## 2016-07-15 HISTORY — PX: BIOPSY: SHX5522

## 2016-07-15 LAB — BASIC METABOLIC PANEL
Anion gap: 7 (ref 5–15)
BUN: 8 mg/dL (ref 6–20)
CALCIUM: 8.2 mg/dL — AB (ref 8.9–10.3)
CO2: 30 mmol/L (ref 22–32)
CREATININE: 0.63 mg/dL (ref 0.61–1.24)
Chloride: 96 mmol/L — ABNORMAL LOW (ref 101–111)
GFR calc Af Amer: >60 mL/min (ref >60)
GFR calc non Af Amer: >60 mL/min (ref >60)
GLUCOSE: 111 mg/dL — AB (ref 65–99)
Potassium: 3.3 mmol/L — ABNORMAL LOW (ref 3.5–5.1)
Sodium: 133 mmol/L — ABNORMAL LOW (ref 135–145)

## 2016-07-15 LAB — CBC
HCT: 24.9 % — ABNORMAL LOW (ref 39.0–52.0)
Hemoglobin: 7.8 g/dL — ABNORMAL LOW (ref 13.0–17.0)
MCH: 25.9 pg — ABNORMAL LOW (ref 26.0–34.0)
MCHC: 31.3 g/dL (ref 30.0–36.0)
MCV: 82.7 fL (ref 78.0–100.0)
PLATELETS: 157 10*3/uL (ref 150–400)
RBC: 3.01 MIL/uL — ABNORMAL LOW (ref 4.22–5.81)
RDW: 19 % — ABNORMAL HIGH (ref 11.5–15.5)
WBC: 5.7 10*3/uL (ref 4.0–10.5)

## 2016-07-15 SURGERY — ESOPHAGOGASTRODUODENOSCOPY (EGD) WITH PROPOFOL
Anesthesia: Monitor Anesthesia Care

## 2016-07-15 MED ORDER — LACTATED RINGERS IV SOLN
Freq: Once | INTRAVENOUS | Status: AC
  Administered 2016-07-15: 11:00:00 via INTRAVENOUS

## 2016-07-15 MED ORDER — SODIUM CHLORIDE 0.9% FLUSH
INTRAVENOUS | Status: AC
  Filled 2016-07-15: qty 10

## 2016-07-15 MED ORDER — PROPOFOL 10 MG/ML IV BOLUS
INTRAVENOUS | Status: DC | PRN
  Administered 2016-07-15: 10 mg via INTRAVENOUS

## 2016-07-15 MED ORDER — LIDOCAINE VISCOUS 2 % MT SOLN
OROMUCOSAL | Status: AC
  Filled 2016-07-15: qty 15

## 2016-07-15 MED ORDER — PROPOFOL 500 MG/50ML IV EMUL
INTRAVENOUS | Status: DC | PRN
  Administered 2016-07-15: 125 ug/kg/min via INTRAVENOUS

## 2016-07-15 MED ORDER — MIDAZOLAM HCL 2 MG/2ML IJ SOLN
INTRAMUSCULAR | Status: AC
  Filled 2016-07-15: qty 2

## 2016-07-15 MED ORDER — LACTATED RINGERS IV SOLN
INTRAVENOUS | Status: DC | PRN
  Administered 2016-07-15: 11:00:00 via INTRAVENOUS

## 2016-07-15 NOTE — Transfer of Care (Signed)
Immediate Anesthesia Transfer of Care Note  Patient: Glenn Black  Procedure(s) Performed: Procedure(s) with comments: ESOPHAGOGASTRODUODENOSCOPY (EGD) WITH PROPOFOL (N/A) BIOPSY - esophageal  Patient Location: PACU  Anesthesia Type:MAC  Level of Consciousness: awake  Airway & Oxygen Therapy: Patient Spontanous Breathing  Post-op Assessment: Report given to RN  Post vital signs: Reviewed  Last Vitals:  Vitals:   07/15/16 1100 07/15/16 1135  BP: (!) 106/46 113/70  Pulse:  99  Resp: 19   Temp:  36.8 C    Last Pain:  Vitals:   07/15/16 1032  TempSrc: Oral  PainSc: 8       Patients Stated Pain Goal: 3 (25/05/39 7673)  Complications: No apparent anesthesia complications

## 2016-07-15 NOTE — Op Note (Signed)
[facility] Patient Name: Glenn Black Procedure Date: 07/15/2016 10:58 AM MRN: [id number] Date of Birth: Nov 01, 1965 Attending MD: Gennette Pacobert Michael Sanders Manninen , MD CSN: [id number] Age: [id number] Admit Type: Outpatient Procedure:                Upper GI endoscopy Indications:              Melena, Providers:                Gennette Pacobert Michael Theophile Harvie, MD, Nena PolioLisa Moore, RN, Burke Keelsrisann                            Tilley, Technician Referring MD:              Medicines:                Propofol per Anesthesia Complications:            No immediate complications. Estimated Blood Loss:     Estimated blood loss was minimal. Procedure:                Pre-Anesthesia Assessment:                           - Prior to the procedure, a History and Physical                            was performed, and patient medications and                            allergies were reviewed. The patient's tolerance of                            previous anesthesia was also reviewed. The risks                            and benefits of the procedure and the sedation                            options and risks were discussed with the patient.                            All questions were answered, and informed consent                            was obtained. Prior Anticoagulants: The patient has                            taken no previous anticoagulant or antiplatelet                            agents. ASA Grade Assessment: III - A patient with                            severe systemic disease. After reviewing the risks  and benefits, the patient was deemed in                            satisfactory condition to undergo the procedure.                           After obtaining informed consent, the endoscope was                            passed under direct vision. Throughout the                            procedure, the patient's blood pressure, pulse, and                            oxygen saturations were  monitored continuously. The                            [id number]) was introduced through the mouth,                            and advanced to the second part of duodenum. The                            patient tolerated the procedure well. The patient                            tolerated the procedure well. Scope In: 11:19:16 AM Scope Out: 11:25:56 AM Total Procedure Duration: 0 hours 6 minutes 40 seconds  Findings:      A small hiatal hernia was present.      Portal hypertensive gastropathy was found in the entire examined stomach.      The duodenal bulb and second portion of the duodenum were normal.      Grade I to 2 varices - 3 columns without bleeding stigmata.      LA Grade B (one or more mucosal breaks greater than 5 mm, not extending       between the tops of two mucosal folds) esophagitis with no bleeding was       found 33 to 38 cm from the incisors. 3 "tongues" of salmon colored       epithelim coming up 4 cm into the distal esophagus. No nodularity. This       was biopsied with a cold forceps for histology. Estimated blood loss was       minimal. This was biopsied with a cold forceps for histology. Estimated       blood loss was minimal. Impression:               I suspect anemia, in part, from bleeding secondary                            to reflux esophagitis in the setting of EtOH                            intoxication.                           -  Small hiatal hernia.                           - Portal hypertensive gastropathy.                           - Normal duodenal bulb and second portion of the                            duodenum.                           - Grade I esophageal varices.                           - LA Grade B esophagitis. Biopsied. Moderate Sedation:      Moderate (conscious) sedation was personally administered by an       anesthesia professional. The following parameters were monitored: oxygen       saturation, heart rate, blood  pressure, respiratory rate, EKG, adequacy       of pulmonary ventilation, and response to care. Total physician       intraservice time was 13 minutes. Recommendation:           - Patient has a contact number available for                            emergencies. The signs and symptoms of potential                            delayed complications were discussed with the                            patient. Return to normal activities tomorrow.                            Written discharge instructions were provided to the                            patient.                           - Cardiac diet.                           - Continue present medications (Protonix 40 mg                            orally twice daily). Begin nonselective beta                            blockade (propranolol or nadolol - daily to reduce                            resting heart rate. Target HR 55 bpm Follow-up on  pathology.                           - Repeat upper endoscopy after studies are complete                            for surveillance based on pathology results.                           - Return to GI clinic in 4 weeks. Will offer                            patient first-ever colonoscopy as an outpatient.                            Follow-up on pathology. Procedure Code(s):        --- Professional ---                           [id number], Esophagogastroduodenoscopy, flexible,                            transoral; with biopsy, single or multiple Diagnosis Code(s):        --- Professional ---                           K44.9, Diaphragmatic hernia without obstruction or                            gangrene                           K76.6, Portal hypertension                           K31.89, Other diseases of stomach and duodenum                           I85.00, Esophageal varices without bleeding                           K20.9, Esophagitis, unspecified                            K92.1, Melena (includes Hematochezia)                           R11.2, Nausea with vomiting, unspecified CPT copyright 2016 [employer]. All rights reserved. The codes documented in this report are preliminary and upon coder review may  be revised to meet current compliance requirements. Gerrit Friends. Alailah Safley, MD Gennette Pac, MD 07/15/2016 11:57:46 AM This report has been signed electronically. Number of Addenda: 0

## 2016-07-15 NOTE — Anesthesia Postprocedure Evaluation (Signed)
Anesthesia Post Note  Patient: Glenn Black  Procedure(s) Performed: Procedure(s) (LRB): ESOPHAGOGASTRODUODENOSCOPY (EGD) WITH PROPOFOL (N/A) BIOPSY  Patient location during evaluation: PACU Anesthesia Type: MAC Level of consciousness: awake and alert and oriented Pain management: pain level controlled Vital Signs Assessment: post-procedure vital signs reviewed and stable Respiratory status: spontaneous breathing Cardiovascular status: stable Postop Assessment: no signs of nausea or vomiting Anesthetic complications: no     Last Vitals:  Vitals:   07/15/16 1145 07/15/16 1200  BP: 109/64   Pulse: 91 92  Resp: (!) 22 (!) 23  Temp:      Last Pain:  Vitals:   07/15/16 1032  TempSrc: Oral  PainSc: 8                  Jeanae Whitmill

## 2016-07-15 NOTE — Progress Notes (Signed)
Patient seen and examined in short stay. Plan for diagnostic EGD today.The risks, benefits, limitations, alternatives and imponderables have been reviewed with the patient. Potential for esophageal dilation, biopsy, etc. have also been reviewed.  Questions have been answered. All parties agreeable.  Further recommendations to follow.

## 2016-07-15 NOTE — Anesthesia Postprocedure Evaluation (Signed)
Anesthesia Post Note  Patient: Glenn Black  Procedure(s) Performed: Procedure(s) (LRB): ESOPHAGOGASTRODUODENOSCOPY (EGD) WITH PROPOFOL (N/A) BIOPSY  Patient location during evaluation: PACU Anesthesia Type: MAC Level of consciousness: awake and patient cooperative Pain management: pain level controlled Vital Signs Assessment: post-procedure vital signs reviewed and stable Respiratory status: spontaneous breathing, nonlabored ventilation and respiratory function stable Cardiovascular status: blood pressure returned to baseline Postop Assessment: no signs of nausea or vomiting Anesthetic complications: no     Last Vitals:  Vitals:   07/15/16 1145 07/15/16 1200  BP: 109/64   Pulse: 91 92  Resp: (!) 22 (!) 23  Temp:      Last Pain:  Vitals:   07/15/16 1032  TempSrc: Oral  PainSc: 8                  Melayna Robarts J

## 2016-07-15 NOTE — Care Management Note (Signed)
Case Management Note  Patient Details  Name: Glenn Black MRN: [id number] Date of Birth: April 07, 1965    Expected Discharge Date:  07/14/16               Expected Discharge Plan:     In-House Referral:     Discharge planning Services  CM Consult  Post Acute Care Choice:    Choice offered to:     DME Arranged:    DME Agency:     HH Arranged:    HH Agency:     Status of Service:  Completed, signed off  If discussed at [employer] Length of Stay Meetings, dates discussed:    Additional Comments: Patient does not have Insurance or PCP. [facility] with [facility]. [facility] as well as PCP list if patient acquires insurance.   Jerrik Housholder, Chrystine OilerSharley Diane, RN 07/15/2016, 2:31 PM
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
## 2016-07-15 NOTE — Anesthesia Preprocedure Evaluation (Addendum)
Anesthesia Evaluation  Patient identified by MRN, date of birth, ID band Patient confused    Reviewed: Allergy & Precautions, NPO status , Patient's Chart, lab work & pertinent test results, reviewed documented beta blocker date and time   Airway      Mouth opening: Limited Mouth Opening Comment: Patient doesn't have meaningful interaction, appears to have good chin, poor dentition Dental  (+) Poor Dentition   Pulmonary Current Smoker,    breath sounds clear to auscultation       Cardiovascular Normal cardiovascular exam Rate:Normal     Neuro/Psych PSYCHIATRIC DISORDERS Alcohol abuse   GI/Hepatic PUD, (+) Cirrhosis       ,   Endo/Other    Renal/GU      Musculoskeletal   Abdominal (+) + obese, ascites  Peds  Hematology  (+) anemia , Anesmia 7.8 hgb   Anesthesia Other Findings Hyponatremia 133 latest  Reproductive/Obstetrics                             Anesthesia Physical Anesthesia Plan  ASA: IV  Anesthesia Plan: MAC   Post-op Pain Management:    Induction: Intravenous  Airway Management Planned: Nasal Cannula  Additional Equipment:   Intra-op Plan:   Post-operative Plan:   Informed Consent: I have reviewed the patients History and Physical, chart, labs and discussed the procedure including the risks, benefits and alternatives for the proposed anesthesia with the patient or authorized representative who has indicated his/her understanding and acceptance.     Plan Discussed with: CRNA  Anesthesia Plan Comments:         Anesthesia Quick Evaluation

## 2016-07-16 DIAGNOSIS — K7031 Alcoholic cirrhosis of liver with ascites: Secondary | ICD-10-CM

## 2016-07-16 DIAGNOSIS — R112 Nausea with vomiting, unspecified: Secondary | ICD-10-CM

## 2016-07-16 DIAGNOSIS — K922 Gastrointestinal hemorrhage, unspecified: Secondary | ICD-10-CM

## 2016-07-16 DIAGNOSIS — K209 Esophagitis, unspecified: Secondary | ICD-10-CM

## 2016-07-16 LAB — COMPREHENSIVE METABOLIC PANEL
ALBUMIN: 2.8 g/dL — AB (ref 3.5–5.0)
ALK PHOS: 94 U/L (ref 38–126)
ALT: 38 U/L (ref 17–63)
ANION GAP: 8 (ref 5–15)
AST: 83 U/L — ABNORMAL HIGH (ref 15–41)
BUN: 7 mg/dL (ref 6–20)
CALCIUM: 8.7 mg/dL — AB (ref 8.9–10.3)
CHLORIDE: 98 mmol/L — AB (ref 101–111)
CO2: 30 mmol/L (ref 22–32)
Creatinine, Ser: 0.57 mg/dL — ABNORMAL LOW (ref 0.61–1.24)
GFR calc Af Amer: >60 mL/min (ref >60)
GFR calc non Af Amer: >60 mL/min (ref >60)
GLUCOSE: 106 mg/dL — AB (ref 65–99)
Potassium: 3.3 mmol/L — ABNORMAL LOW (ref 3.5–5.1)
Sodium: 136 mmol/L (ref 135–145)
Total Bilirubin: 4.1 mg/dL — ABNORMAL HIGH (ref 0.3–1.2)
Total Protein: 6.2 g/dL — ABNORMAL LOW (ref 6.5–8.1)

## 2016-07-16 LAB — CBC
HCT: 24.3 % — ABNORMAL LOW (ref 39.0–52.0)
HEMOGLOBIN: 7.5 g/dL — AB (ref 13.0–17.0)
MCH: 25.6 pg — AB (ref 26.0–34.0)
MCHC: 30.9 g/dL (ref 30.0–36.0)
MCV: 82.9 fL (ref 78.0–100.0)
Platelets: 145 10*3/uL — ABNORMAL LOW (ref 150–400)
RBC: 2.93 MIL/uL — ABNORMAL LOW (ref 4.22–5.81)
RDW: 19.8 % — ABNORMAL HIGH (ref 11.5–15.5)
WBC: 5.6 10*3/uL (ref 4.0–10.5)

## 2016-07-16 MED ORDER — ADULT MULTIVITAMIN W/MINERALS CH: 1.0000 | ORAL_TABLET | Freq: Every day | ORAL | Status: AC

## 2016-07-16 MED ORDER — SPIRONOLACTONE 100 MG PO TABS: 100.0000 mg | ORAL_TABLET | Freq: Every day | ORAL | 2 refills | Status: AC

## 2016-07-16 MED ORDER — SPIRONOLACTONE 25 MG PO TABS
100.0000 mg | ORAL_TABLET | Freq: Every day | ORAL | Status: DC
  Administered 2016-07-16: 100 mg via ORAL
  Filled 2016-07-16: qty 4

## 2016-07-16 MED ORDER — PANTOPRAZOLE SODIUM 40 MG PO TBEC: 40.0000 mg | DELAYED_RELEASE_TABLET | Freq: Two times a day (BID) | ORAL | 2 refills | Status: AC

## 2016-07-16 MED ORDER — FOLIC ACID 1 MG PO TABS: 1.0000 mg | ORAL_TABLET | Freq: Every day | ORAL | Status: AC

## 2016-07-16 MED ORDER — THIAMINE HCL 100 MG PO TABS: 100.0000 mg | ORAL_TABLET | Freq: Every day | ORAL | Status: AC

## 2016-07-16 MED ORDER — FUROSEMIDE 40 MG PO TABS: 40.0000 mg | ORAL_TABLET | Freq: Every day | ORAL | 2 refills | Status: AC

## 2016-07-16 NOTE — Progress Notes (Addendum)
CM received call from RN for medication asst.  CM MATCHED pt and explained the program to pt's sister, who will be taking pt home.  Family verbalized understanding of MATCH parameters.  CM [facility] [address] [facility] [phone].  No other CM needs were communicated.  CM received call from RN, [facility]Sandra [facility].  Unfortunately, pt has no insurance and unable to pay out of pocket and does not meet charity criteria for [facility] health services [facility]  No other CM needs were communicated.

## 2016-07-16 NOTE — Progress Notes (Signed)
  Subjective:  Patient states he did not eat his breakfast but was "old". He tells me that he is going home today. He says he will need "perc -10"for back pain. He gets few pills from his friend and he uses. He denies nausea or vomiting. He feels abdominal distention is about the same. He states lower extremity edema has not changed.  Objective: Blood pressure 124/62, pulse 95, temperature 98.4 F (36.9 C), temperature source Oral, resp. rate 18, height 6' (1.829 m), weight 240 lb 4.8 oz (109 kg), SpO2 96 %. Patient is alert and in no acute distress. He does not have asterixis. Conjunctiva is pale. Sclera is icteric Lungs are clear to auscultation. Abdomen is distended and somewhat tense but nontender. He has 2-3+ pitting edema involving both legs. Few small areas of ecchymosis noted.  Labs/studies Results:   Recent Labs  07/14/16 0519 07/15/16 0438 07/16/16 0601  WBC 6.2 5.7 5.6  HGB 7.3* 7.8* 7.5*  HCT 23.2* 24.9* 24.3*  PLT 162 157 145*    BMET   Recent Labs  07/14/16 0519 07/15/16 0438 07/16/16 0601  NA 129* 133* 136  K 3.0* 3.3* 3.3*  CL 92* 96* 98*  CO2 28 30 30   GLUCOSE 101* 111* 106*  BUN 7 8 7   CREATININE 0.58* 0.63 0.57*  CALCIUM 8.2* 8.2* 8.7*    LFT   Recent Labs  07/14/16 0519 07/16/16 0601  PROT 6.1* 6.2*  ALBUMIN 2.7* 2.8*  AST 121* 83*  ALT 45 38  ALKPHOS 102 94  BILITOT 3.7* 4.1*    PT/INR   Recent Labs  07/14/16 0519  LABPROT 17.5*  INR 1.42     Assessment:  #1.Alcoholic liver disease. In addition to alcoholic cirrhosis he also has biochemical evidence of alcoholic hepatitis. His disease is complicated by cholestasis and fluid overload with ascites and edema. Ascites not significant for abdominal paracentesis. He is on IV furosemide and would benefit from spironolactone.  #2. Anemia. Anemia appears to be multifactorial including history of GI bleed. EGD yesterday revealed erosive reflux esophagitis esophageal varices not large  enough to be banded as well as portal gastropathy. Hemoglobin is low but stable. He will be transfused if hemoglobin drops below 7 g.  #3. Electrolyte abnormalities. He had hyponatremia on admission but serum sodium is not normal. Patient is on IV furosemide.  Recommendations:  Spironolactone 100 mg by mouth daily. Check bilirubin and INR in a.m. to recalculate HDF.

## 2016-07-16 NOTE — Anesthesia Postprocedure Evaluation (Signed)
Anesthesia Post Note  Patient: Glenn Black  Procedure(s) Performed: Procedure(s) (LRB): ESOPHAGOGASTRODUODENOSCOPY (EGD) WITH PROPOFOL (N/A) BIOPSY  Patient location during evaluation: Nursing Unit Anesthesia Type: MAC Level of consciousness: awake and patient cooperative Pain management: pain level controlled Vital Signs Assessment: post-procedure vital signs reviewed and stable Respiratory status: spontaneous breathing, nonlabored ventilation and respiratory function stable Cardiovascular status: blood pressure returned to baseline Postop Assessment: no signs of nausea or vomiting Anesthetic complications: no     Last Vitals:  Vitals:   07/15/16 2258 07/16/16 0637  BP: 105/71 124/62  Pulse: 98 95  Resp: 18 18  Temp: 36.9 C 36.9 C    Last Pain:  Vitals:   07/16/16 0637  TempSrc: Oral  PainSc:                  Onica Davidovich J

## 2016-07-16 NOTE — Addendum Note (Signed)
Addendum  created 07/16/16 0954 by Amador Braddy J, CRNA   Sign clinical note    

## 2016-07-16 NOTE — Evaluation (Addendum)
Physical Therapy Evaluation Patient Details Name: Glenn Black MRN: [id number] DOB: 02-May-1965 Today's Date: 07/16/2016   History of Present Illness  Glenn Black is an 51 y.o. male with benign PMH on No meds, but clearly by virtue of never seeing any physician, drinks significantly (daily 16 beers per day), presented to the ER with swelling of his abdomen and both legs.  He has no hematemesis, and had no black stool.  He has no abdominal pain.  Evaluation in the ER showed stable hemodynamics, but with Hb of 5.7 g per dL, and was confirmed with repeated lab.  His Na was 113, normal Cr and normal K.  Total bili was 3.5, with INR of 1.35.  He has no leukocytosis and platelet count was not low.  His stool guaic was negative.  EDP spoke with Dr Sydell Axon of GI, and hospitalist was asked to admit him for new diagnosis of alcoholic cirrhosis, with hyponatremia, GI bleed with Hb of 5.7, and anasarca with albumin at 3 and Cr of 0.61.     Clinical Impression  Patient received in bed, willing to participate in skilled PT services. Patient able to complete bed mobility with extended time and Mod(I) however noted that gown was placed on patient backwards and patient able to stand inside walker to correct this. Able to ambulate approximately 23f in hallway with walker and general S. Patient able to complete toileting with S however with poor safety awareness in general in small space requiring cues and min guard, became quite agitated and irritable with PT during this time. RN available to assist in replacing soiled linens this session. Patient returned to bed and requested to remain sitting until RN returned. Bed alarm activated and table placed in front of patient to discourage attempt at independent mobility. Patient will benefit from walker and HHPT to ensure safety with return home. Note that MD has signed DC order so patient will likely not have opportunity for further skilled PT services in this facility due to  DC today and regardless appears close to likely baseline per his description of mobility at home.     Follow Up Recommendations Home health PT    Equipment Recommendations  Standard walker    Recommendations for Other Services       Precautions / Restrictions Precautions Precautions: Fall Restrictions Weight Bearing Restrictions: No      Mobility  Bed Mobility Overal bed mobility: Modified Independent                Transfers Overall transfer level: Modified independent Equipment used: Rolling walker (2 wheeled)                Ambulation/Gait Ambulation/Gait assistance: Supervision Ambulation Distance (Feet): 60 Feet Assistive device: Rolling walker (2 wheeled)       General Gait Details: trunk flexed, reduced gait speed   Stairs            Wheelchair Mobility    Modified Rankin (Stroke Patients Only)       Balance Overall balance assessment: No apparent balance deficits (not formally assessed)                                           Pertinent Vitals/Pain Pain Assessment: No/denies pain    Home Living Family/patient expects to be discharged to:: Private residence Living Arrangements: Other relatives Available Help at Discharge: Family  Type of Home: House Home Access: Stairs to enter Entrance Stairs-Rails: Right Entrance Stairs-Number of Steps: 6 Home Layout: One level Home Equipment: None      Prior Function Level of Independence: Independent               Hand Dominance        Extremity/Trunk Assessment        Lower Extremity Assessment Lower Extremity Assessment: Overall WFL for tasks assessed    Cervical / Trunk Assessment Cervical / Trunk Assessment: Normal  Communication   Communication: No difficulties  Cognition Arousal/Alertness: Awake/alert Behavior During Therapy: WFL for tasks assessed/performed;Agitated Overall Cognitive Status: Within Functional Limits for tasks assessed                                         General Comments      Exercises     Assessment/Plan    PT Assessment All further PT needs can be met in the next venue of care  PT Problem List Decreased safety awareness;Decreased coordination       PT Treatment Interventions      PT Goals (Current goals can be found in the Care Plan section)  Acute Rehab PT Goals Patient Stated Goal: none stated  PT Goal Formulation: With patient Time For Goal Achievement: 07/30/16 Potential to Achieve Goals: Good    Frequency     Barriers to discharge        Co-evaluation               AM-PAC PT "6 Clicks" Daily Activity  Outcome Measure Difficulty turning over in bed (including adjusting bedclothes, sheets and blankets)?: None Difficulty moving from lying on back to sitting on the side of the bed? : None Difficulty sitting down on and standing up from a chair with arms (e.g., wheelchair, bedside commode, etc,.)?: None Help needed moving to and from a bed to chair (including a wheelchair)?: None Help needed walking in hospital room?: None Help needed climbing 3-5 steps with a railing? : A Little 6 Click Score: 23    End of Session   Activity Tolerance: Patient tolerated treatment well Patient left: in bed;with bed alarm set   PT Visit Diagnosis: Difficulty in walking, not elsewhere classified (R26.2);Other abnormalities of gait and mobility (R26.89)    Time: 1015-1056 PT Time Calculation (min) (ACUTE ONLY): 41 min   Charges:   PT Evaluation $PT Eval Low Complexity: 1 Procedure     PT G Codes:   PT G-Codes **NOT FOR INPATIENT CLASS** Functional Assessment Tool Used: AM-PAC 6 Clicks Basic Mobility;Clinical judgement Functional Limitation: Mobility: Walking and moving around Mobility: Walking and Moving Around Current Status (K2706): At least 20 percent but less than 40 percent impaired, limited or restricted Mobility: Walking and Moving Around Goal Status  [phone]): At least 20 percent but less than 40 percent impaired, limited or restricted Mobility: Walking and Moving Around Discharge Status [phone]): At least 20 percent but less than 40 percent impaired, limited or restricted    Deniece Ree PT, DPT [phone]
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
## 2016-07-20 ENCOUNTER — Encounter: Payer: Self-pay | Admitting: Internal Medicine

## 2016-07-20 ENCOUNTER — Telehealth: Payer: Self-pay

## 2016-07-20 NOTE — Telephone Encounter (Signed)
Per RMR- Send letter to patient.  Send copy of letter with path to referring provider and PCP.   He should have a follow-up appointment with extender in about 2-3 months for cirrhosis care.

## 2016-07-20 NOTE — Telephone Encounter (Signed)
Letter mailed to the pt. 

## 2016-07-20 NOTE — Telephone Encounter (Signed)
APPT MADE AND LETTER SENT  °

## 2016-07-21 ENCOUNTER — Encounter (HOSPITAL_COMMUNITY): Payer: Self-pay | Admitting: Internal Medicine

## 2016-09-19 ENCOUNTER — Encounter: Payer: Self-pay | Admitting: Gastroenterology

## 2016-09-19 ENCOUNTER — Ambulatory Visit: Payer: Self-pay | Admitting: Nurse Practitioner

## 2016-09-19 ENCOUNTER — Telehealth: Payer: Self-pay | Admitting: Gastroenterology

## 2016-09-19 ENCOUNTER — Ambulatory Visit: Payer: Self-pay | Admitting: Gastroenterology

## 2016-09-19 NOTE — Telephone Encounter (Signed)
PATIENT WAS A NO SHOW AND LETTER SENT  °

## 2017-05-16 ENCOUNTER — Emergency Department (HOSPITAL_COMMUNITY)
Admission: EM | Admit: 2017-05-16 | Discharge: 2017-05-16 | Disposition: A | Discharge status: Home / Self Care | Payer: Self-pay | Attending: Emergency Medicine | Admitting: Emergency Medicine

## 2017-05-16 ENCOUNTER — Encounter (HOSPITAL_COMMUNITY): Payer: Self-pay | Admitting: Emergency Medicine

## 2017-05-16 DIAGNOSIS — F17210 Nicotine dependence, cigarettes, uncomplicated: Secondary | ICD-10-CM | POA: Insufficient documentation

## 2017-05-16 DIAGNOSIS — Z79899 Other long term (current) drug therapy: Secondary | ICD-10-CM | POA: Insufficient documentation

## 2017-05-16 DIAGNOSIS — Z7982 Long term (current) use of aspirin: Secondary | ICD-10-CM | POA: Insufficient documentation

## 2017-05-16 DIAGNOSIS — K429 Umbilical hernia without obstruction or gangrene: Secondary | ICD-10-CM | POA: Insufficient documentation

## 2017-05-16 DIAGNOSIS — K4090 Unilateral inguinal hernia, without obstruction or gangrene, not specified as recurrent: Secondary | ICD-10-CM | POA: Insufficient documentation

## 2017-05-16 HISTORY — DX: Unspecified cirrhosis of liver: K74.60

## 2017-05-16 NOTE — ED Provider Notes (Signed)
[facility] EMERGENCY DEPARTMENT Provider Note   CSN: [id number] Arrival date & time: 05/16/17  1535     History   Chief Complaint Chief Complaint  Patient presents with  . Inguinal Hernia    HPI Glenn Black is a 52 y.o. male.  Patient presents with hernia at right groin which is had for 4 years and hernia at umbilicus which she has had for 2 months.  He denies any pain denies nausea or vomiting last bowel movement this morning, normal.  No other associated symptoms.  No treatment prior to coming here.  He is afraid that his hernias will "burst"  HPI  Past Medical History:  Diagnosis Date  . Alcohol abuse   . Cirrhosis [facility](HCC)     Patient Active Problem List   Diagnosis Date Noted  . Leg pain   . Elevated LFTs   . Tobacco abuse 07/12/2016  . Cannabis use disorder, moderate, dependence (HCC) 07/12/2016  . Acute blood loss anemia 07/12/2016  . Pleural effusion 07/12/2016  . Peripheral edema   . Alcoholic cirrhosis of liver with ascites (HCC) 07/11/2016  . Hyponatremia 07/11/2016  . GI bleeding 07/11/2016  . Anemia 07/11/2016  . Anasarca 07/11/2016  . Alcohol abuse 07/11/2016  . GI bleed 07/11/2016    Past Surgical History:  Procedure Laterality Date  . BIOPSY  07/15/2016   Procedure: BIOPSY;  Surgeon: Corbin Adeourk, Robert M, MD;  Location: [facility] ENDO SUITE;  Service: Endoscopy;;  esophageal  . ESOPHAGOGASTRODUODENOSCOPY (EGD) WITH PROPOFOL N/A 07/15/2016   Procedure: ESOPHAGOGASTRODUODENOSCOPY (EGD) WITH PROPOFOL;  Surgeon: Corbin Adeourk, Robert M, MD;  Location: [facility] ENDO SUITE;  Service: Endoscopy;  Laterality: N/A;  . None          Home Medications    Prior to Admission medications   Medication Sig Start Date End Date Taking? Authorizing Provider  aspirin 81 MG chewable tablet Chew 81-324 mg by mouth daily as needed for mild pain or moderate pain.    [provider]  Cyanocobalamin (B-12 PO) Take 2 tablets by mouth daily.    [provider]  famotidine  (HEARTBURN RELIEF) 10 MG tablet Take 10 mg by mouth daily as needed for heartburn or indigestion.    [provider]  folic acid (FOLVITE) 1 MG tablet Take 1 tablet (1 mg total) by mouth daily. 07/17/16   Philip AspenHernandez Acosta, Limmie PatriciaEstela Y, MD  furosemide (LASIX) 40 MG tablet Take 1 tablet (40 mg total) by mouth daily. 07/16/16   Philip AspenHernandez Acosta, Limmie PatriciaEstela Y, MD  Multiple Vitamin (MULTIVITAMIN WITH MINERALS) TABS tablet Take 1 tablet by mouth daily. 07/17/16   Philip AspenHernandez Acosta, Limmie PatriciaEstela Y, MD  pantoprazole (PROTONIX) 40 MG tablet Take 1 tablet (40 mg total) by mouth 2 (two) times daily before a meal. 07/16/16   Philip AspenHernandez Acosta, Limmie PatriciaEstela Y, MD  spironolactone (ALDACTONE) 100 MG tablet Take 1 tablet (100 mg total) by mouth daily. 07/16/16   Philip AspenHernandez Acosta, Limmie PatriciaEstela Y, MD  tetrahydrozoline (EYE DROPS) 0.05 % ophthalmic solution Place 1 drop into both eyes daily.    [provider]  thiamine 100 MG tablet Take 1 tablet (100 mg total) by mouth daily. 07/17/16   Philip AspenHernandez Acosta, Limmie PatriciaEstela Y, MD    Family History Family History  Problem Relation Age of Onset  . Colon cancer Neg Hx     Social History Social History   Tobacco Use  . Smoking status: Current Every Day Smoker    Packs/day: 1.50    Types: Cigarettes  .  Smokeless tobacco: Never Used  Substance Use Topics  . Alcohol use: Yes    Comment: 18 beers daily for 20+ years   . Drug use: Yes    Types: Marijuana    Comment: used 8 days ago     Allergies   Penicillins   Review of Systems Review of Systems  Gastrointestinal: Positive for abdominal distention. Negative for abdominal pain, blood in stool and nausea.       Hernias at umbilicus and right groin  Allergic/Immunologic: Positive for immunocompromised state.       Alcohol abuse  All other systems reviewed and are negative.    Physical Exam Updated Vital Signs BP 136/71 (BP Location: Right Arm)   Pulse 95   Temp 97.6 F (36.4 C) (Oral)   Resp 19   Ht 6' (1.829 m)   SpO2  100%   BMI 32.59 kg/m   Physical Exam  Constitutional:  Chronically ill-appearing  HENT:  Head: Normocephalic and atraumatic.  Eyes: Pupils are equal, round, and reactive to light. Conjunctivae are normal.  Neck: Neck supple. No tracheal deviation present. No thyromegaly present.  Cardiovascular: Normal rate and regular rhythm.  No murmur heard. Pulmonary/Chest: Effort normal and breath sounds normal.  Abdominal: Soft. Bowel sounds are normal. He exhibits mass. He exhibits no distension. There is no tenderness.  There is a golf ball sized area which is soft not red warm or tender and easily reproducible.  There is also a baseball sized right sided inguinal hernia which is soft easily reducible and nontender.  Musculoskeletal: Normal range of motion. He exhibits no edema or tenderness.  Neurological: He is alert. Coordination normal.  Skin: Skin is warm and dry. No rash noted.  Psychiatric:  Patient is highly argumentative.    Nursing note and vitals reviewed.    ED Treatments / Results  Labs (all labs ordered are listed, but only abnormal results are displayed) Labs Reviewed - No data to display  EKG None  Radiology No results found.  Procedures Procedures (including critical care time)  Medications Ordered in ED Medications - No data to display   Initial Impression / Assessment and Plan / ED Course  I have reviewed the triage vital signs and the nursing notes.  Pertinent labs & imaging results that were available during my care of the patient were reviewed by me and considered in my medical decision making (see chart for details).     I had lengthy discussion with patient that hernias did not require emergency treatment or surgery as he has no GI symptoms they are not tender no signs of infection and no signs of bowel obstruction or hernia incarceration.  Hernias are easily reducible. He is referred to Dr. Henreitta Leber.  I counseled patient for 5 minutes on smoking  cessation Final Clinical Impressions(s) / ED Diagnoses   Final diagnoses:  Inguinal hernia of left side without obstruction or gangrene  Umbilical hernia without obstruction and without gangrene   Diagnosis #3 tobacco abuse ED Discharge Orders    None       Doug Sou, MD 05/16/17 1621

## 2017-05-16 NOTE — ED Triage Notes (Signed)
Pt states having an inguinal hernia for months with increasing in size and discomfort.   Pt also states having an umbilical hernia x2 months with pain

## 2017-05-16 NOTE — Discharge Instructions (Addendum)
Call Dr. Jadene PieriniBridges's office tomorrow to schedule an appointment to discuss getting your hernias repaired.  Ask your primary care physician to help you to stop smoking.  If you do not have a current primary care physician [facility] or the number on these instructions to get one

## 2017-06-14 ENCOUNTER — Encounter: Payer: Self-pay | Admitting: Internal Medicine

## 2017-07-15 DEATH — deceased

## 2017-10-19 IMAGING — CT CT ABDOMEN W/ CM
3 of 5 series · 16 of 46 positions shown, 18 images · IV contrast (Isovue)
Comparison: None.

CLINICAL DATA: Lower leg swelling, elevated liver function tests.

EXAM:
CT ABDOMEN WITH CONTRAST
TECHNIQUE: Multidetector CT imaging of the abdomen was performed using the
standard protocol following bolus administration of intravenous
contrast.
CONTRAST:  100mL [id number] IOPAMIDOL ([id number]) INJECTION 61%

[Series 2: axial st · axial · 0.83mm/px · z∈[+1131,+1411]mm · 11 of 68 slices shown, 13 images]
[im 6/68  soft-tissue]
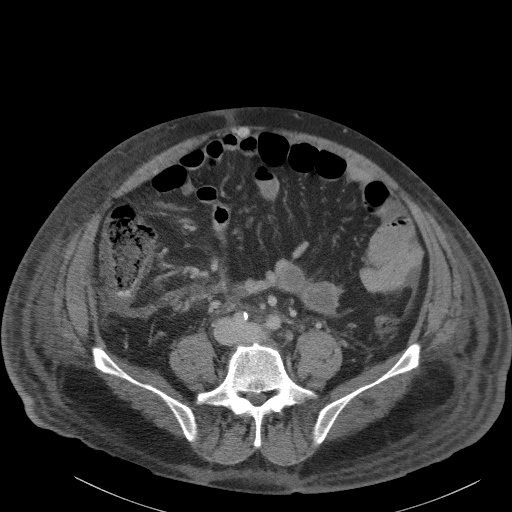
[im 6/68  bone]
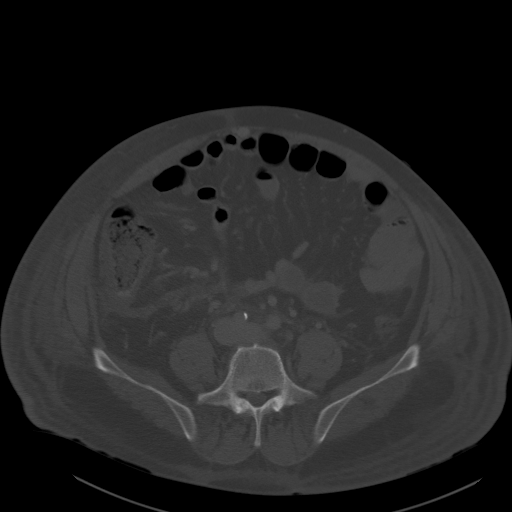
[im 12/68  soft-tissue]
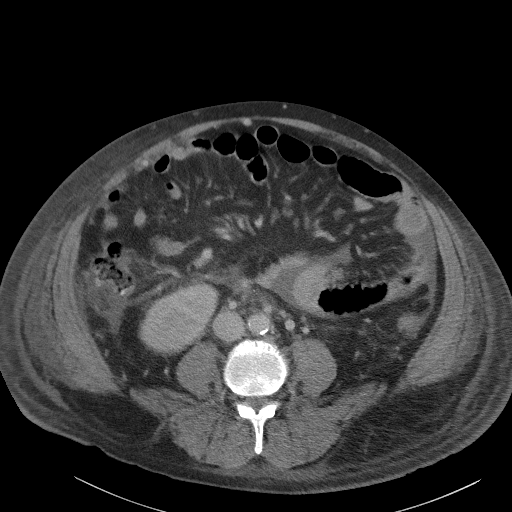
[im 17/68  soft-tissue]
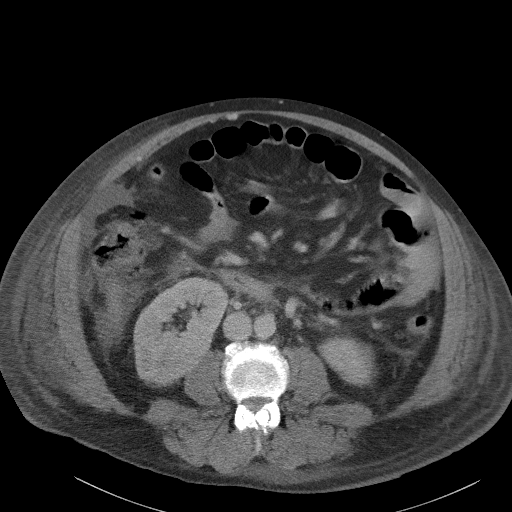
[im 23/68  soft-tissue]
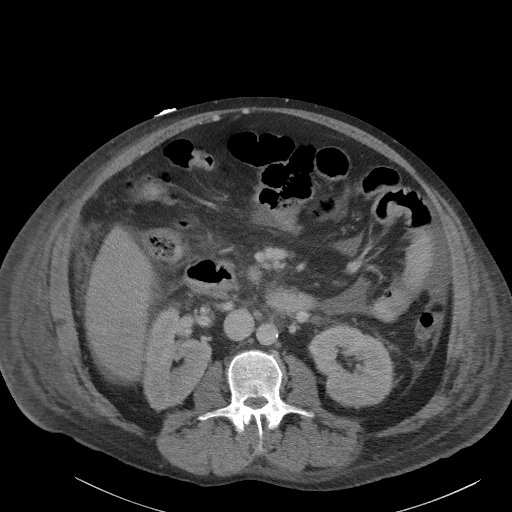
[im 28/68  soft-tissue]
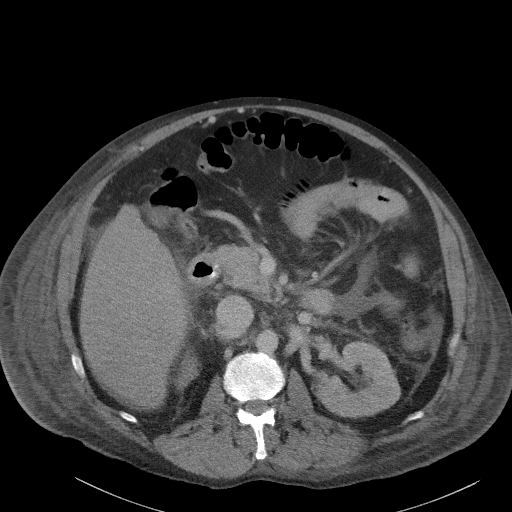
[im 34/68  soft-tissue]
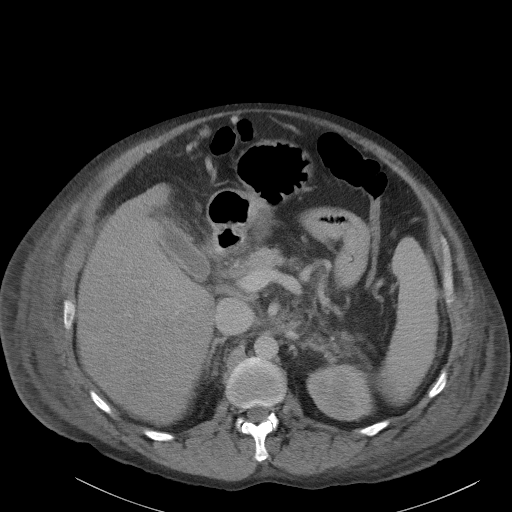
[im 40/68  soft-tissue]
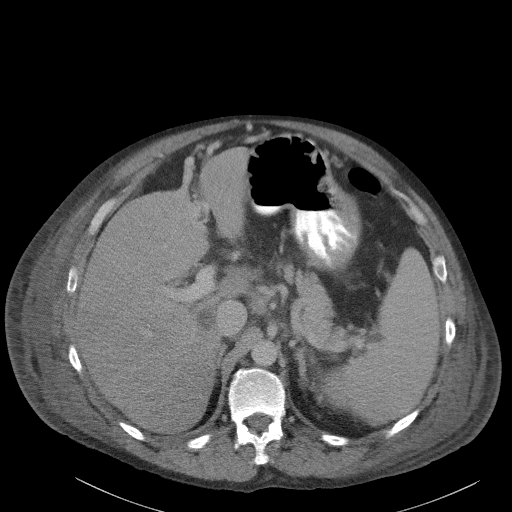
[im 45/68  soft-tissue]
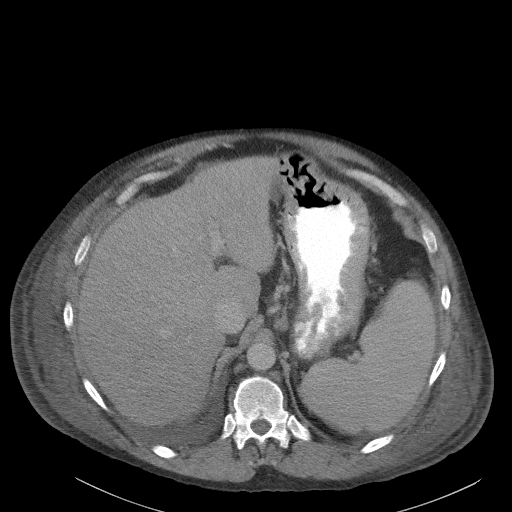
[im 51/68  soft-tissue]
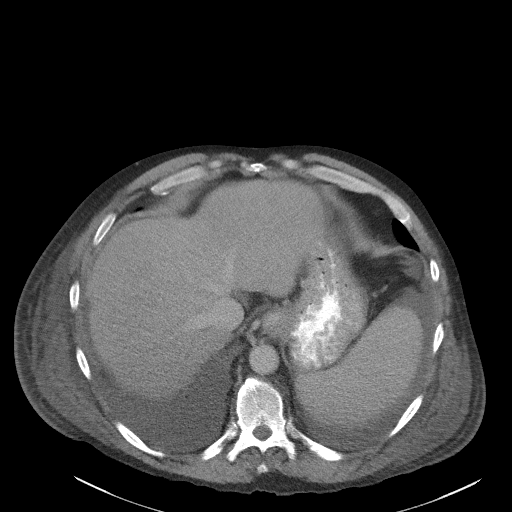
[im 51/68  bone]
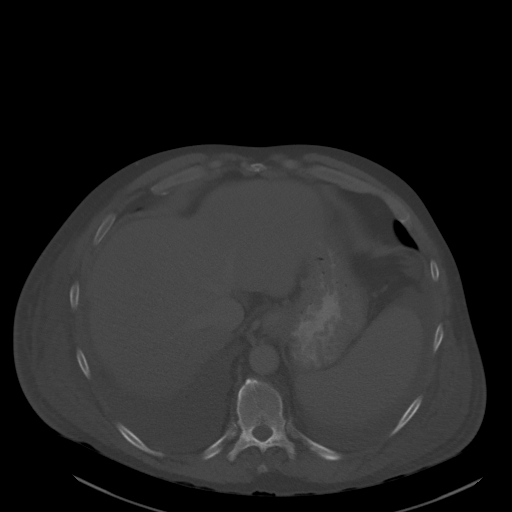
[im 56/68  soft-tissue]
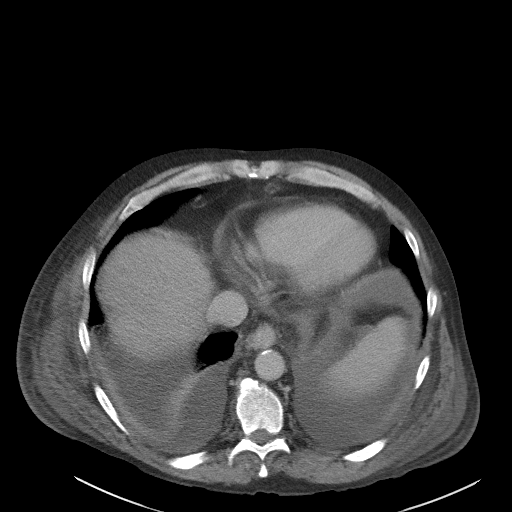
[im 62/68  soft-tissue]
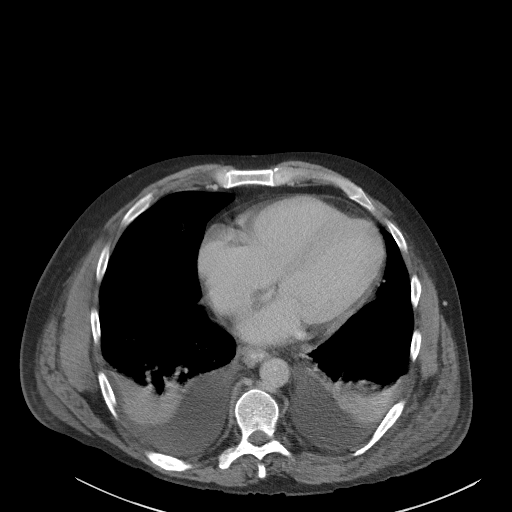

[Series 4: lung bases · axial · 0.92mm/px · z∈[+1281,+1306]mm · 2 of 38 slices shown]
[im 6/38  bone]
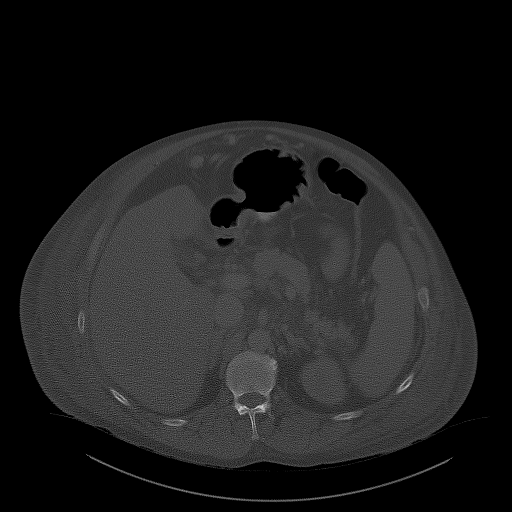
[im 11/38  bone]
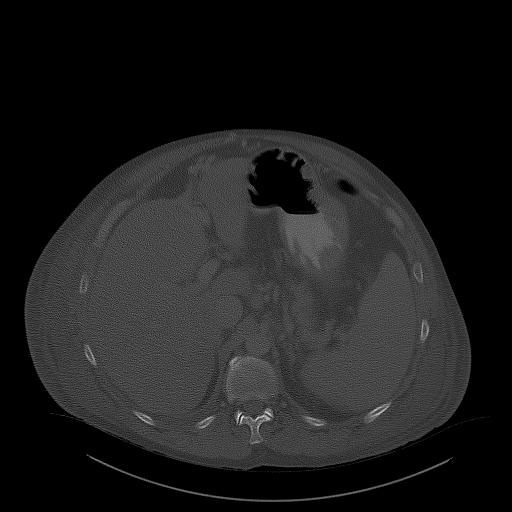

[Series 5: coronal st · coronal · 0.70mm/px · 3 of 110 slices shown]
[im 37/110  soft-tissue]
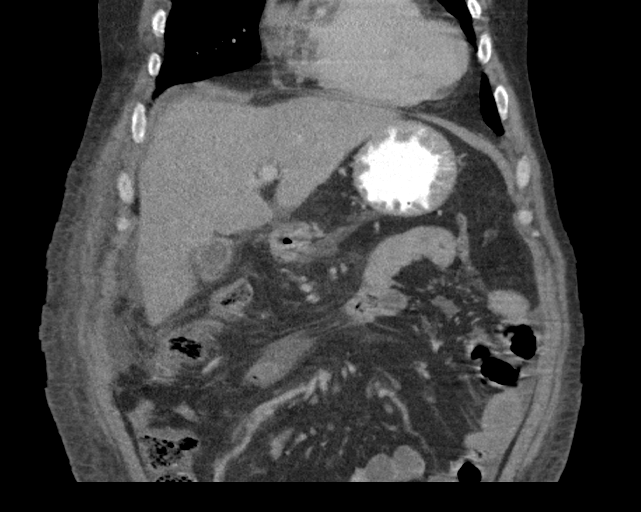
[im 49/110  soft-tissue]
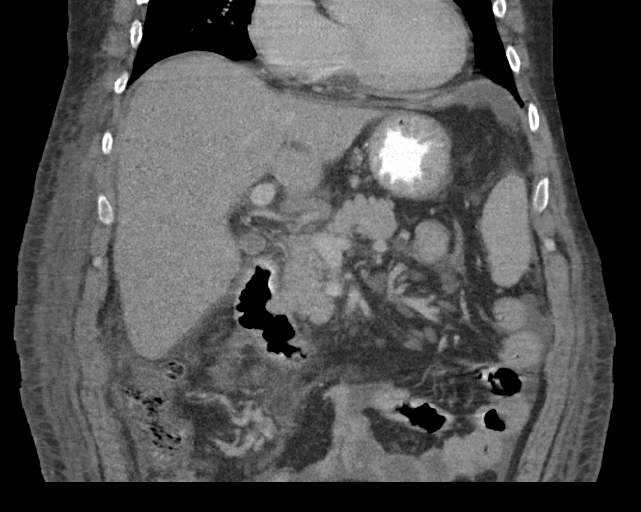
[im 61/110  soft-tissue]
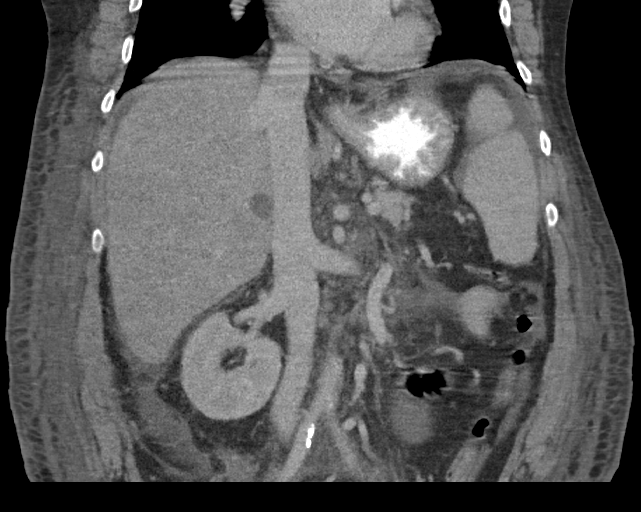

[16 of 46 positions shown; findings below may reference images not displayed]

FINDINGS: Lower chest: Moderate bilateral pleural effusions with compressive
atelectasis in both lower lobes. Heart is enlarged. Coronary artery
calcification. No pericardial effusion. Distal esophageal varices.

Hepatobiliary: Liver margin is irregular. 2.0 cm low-attenuation
lesion in the medial aspect of the right hepatic lobe is likely a
cyst. Gallbladder is unremarkable. No biliary ductal dilatation.

Pancreas: Negative.

Spleen: Measures approximately 14.9 cm, otherwise unremarkable.

Adrenals/Urinary Tract: Right adrenal gland is unremarkable. There
may be slight thickening of the left adrenal gland. Kidneys are
unremarkable.

Stomach/Bowel: Stomach and visualized portions of the small bowel
and colon are grossly unremarkable.

Vascular/Lymphatic: Atherosclerotic calcification of the arterial
vasculature without abdominal aortic aneurysm. Recanalized
paraumbilical vein. Gastroesophageal varices, as mentioned
previously. Periportal lymph nodes measure up to 1.4 cm.
Gastrohepatic ligament lymph nodes measure up to 11 mm.

Other: Scattered ascites. Visualize mesenteries and peritoneum are
otherwise unremarkable.

Musculoskeletal: Degenerative changes in the spine. No worrisome
lytic or sclerotic lesions.
IMPRESSION: 1. Cirrhosis with portal hypertension as evidenced by
gastroesophageal varices and splenomegaly.
2. Small ascites.
3. Borderline enlarged upper abdominal lymph nodes, likely reactive.
4. Moderate bilateral pleural effusions with compressive atelectasis
in both lower lobes.
5.  Aortic atherosclerosis ([id number]).
